# Patient Record
Sex: Female | Born: 1970 | Race: White | Hispanic: No | Smoking: Former smoker
Health system: Southern US, Community
[De-identification: ages and names within clinical notes are randomized; demographics above are authoritative.]

## PROBLEM LIST (undated history)

## (undated) DIAGNOSIS — E282 Polycystic ovarian syndrome: Secondary | ICD-10-CM

## (undated) DIAGNOSIS — F32A Depression, unspecified: Secondary | ICD-10-CM

## (undated) DIAGNOSIS — F329 Major depressive disorder, single episode, unspecified: Secondary | ICD-10-CM

## (undated) DIAGNOSIS — E079 Disorder of thyroid, unspecified: Secondary | ICD-10-CM

## (undated) DIAGNOSIS — Z87442 Personal history of urinary calculi: Secondary | ICD-10-CM

## (undated) DIAGNOSIS — I10 Essential (primary) hypertension: Secondary | ICD-10-CM

## (undated) HISTORY — PX: CYSTOSCOPY: SUR368

## (undated) HISTORY — PX: OTHER SURGICAL HISTORY: SHX169

## (undated) HISTORY — DX: Disorder of thyroid, unspecified: E07.9

## (undated) HISTORY — DX: Major depressive disorder, single episode, unspecified: F32.9

## (undated) HISTORY — DX: Polycystic ovarian syndrome: E28.2

## (undated) HISTORY — DX: Depression, unspecified: F32.A

## (undated) HISTORY — DX: Personal history of urinary calculi: Z87.442

---

## 1999-08-15 ENCOUNTER — Other Ambulatory Visit: Admission: RE | Admit: 1999-08-15 | Discharge: 1999-08-15 | Payer: Self-pay | Admitting: Internal Medicine

## 2000-03-29 ENCOUNTER — Other Ambulatory Visit (HOSPITAL_COMMUNITY): Admission: RE | Admit: 2000-03-29 | Discharge: 2000-04-12 | Payer: Self-pay | Admitting: Psychiatry

## 2000-06-14 ENCOUNTER — Ambulatory Visit (HOSPITAL_COMMUNITY): Admission: RE | Admit: 2000-06-14 | Discharge: 2000-06-14 | Payer: Self-pay | Admitting: *Deleted

## 2000-06-20 ENCOUNTER — Ambulatory Visit (HOSPITAL_COMMUNITY): Admission: RE | Admit: 2000-06-20 | Discharge: 2000-06-20 | Payer: Self-pay | Admitting: *Deleted

## 2003-04-01 ENCOUNTER — Emergency Department (HOSPITAL_COMMUNITY): Admission: EM | Admit: 2003-04-01 | Discharge: 2003-04-01 | Payer: Self-pay | Admitting: Emergency Medicine

## 2004-06-22 ENCOUNTER — Emergency Department (HOSPITAL_COMMUNITY): Admission: EM | Admit: 2004-06-22 | Discharge: 2004-06-23 | Payer: Self-pay | Admitting: Emergency Medicine

## 2004-06-29 ENCOUNTER — Ambulatory Visit (HOSPITAL_COMMUNITY): Admission: RE | Admit: 2004-06-29 | Discharge: 2004-06-29 | Payer: Self-pay | Admitting: Urology

## 2004-07-28 ENCOUNTER — Ambulatory Visit (HOSPITAL_BASED_OUTPATIENT_CLINIC_OR_DEPARTMENT_OTHER): Admission: RE | Admit: 2004-07-28 | Discharge: 2004-07-28 | Payer: Self-pay | Admitting: Urology

## 2004-07-30 ENCOUNTER — Emergency Department (HOSPITAL_COMMUNITY): Admission: EM | Admit: 2004-07-30 | Discharge: 2004-07-30 | Payer: Self-pay | Admitting: *Deleted

## 2004-08-09 ENCOUNTER — Ambulatory Visit (HOSPITAL_COMMUNITY): Admission: RE | Admit: 2004-08-09 | Discharge: 2004-08-09 | Payer: Self-pay | Admitting: Urology

## 2004-08-09 ENCOUNTER — Ambulatory Visit (HOSPITAL_BASED_OUTPATIENT_CLINIC_OR_DEPARTMENT_OTHER): Admission: RE | Admit: 2004-08-09 | Discharge: 2004-08-09 | Payer: Self-pay | Admitting: Urology

## 2004-08-10 ENCOUNTER — Inpatient Hospital Stay (HOSPITAL_COMMUNITY): Admission: EM | Admit: 2004-08-10 | Discharge: 2004-08-11 | Payer: Self-pay | Admitting: Emergency Medicine

## 2011-12-19 ENCOUNTER — Ambulatory Visit (INDEPENDENT_AMBULATORY_CARE_PROVIDER_SITE_OTHER): Payer: BC Managed Care – PPO | Admitting: Physician Assistant

## 2011-12-19 ENCOUNTER — Encounter: Payer: Self-pay | Admitting: Physician Assistant

## 2011-12-19 VITALS — BP 140/92 | HR 120 | Ht 61.5 in | Wt 245.0 lb

## 2011-12-19 DIAGNOSIS — R000 Tachycardia, unspecified: Secondary | ICD-10-CM

## 2011-12-19 DIAGNOSIS — R112 Nausea with vomiting, unspecified: Secondary | ICD-10-CM

## 2011-12-19 DIAGNOSIS — R1013 Epigastric pain: Secondary | ICD-10-CM

## 2011-12-19 DIAGNOSIS — E282 Polycystic ovarian syndrome: Secondary | ICD-10-CM

## 2011-12-19 DIAGNOSIS — I10 Essential (primary) hypertension: Secondary | ICD-10-CM

## 2011-12-19 DIAGNOSIS — R1011 Right upper quadrant pain: Secondary | ICD-10-CM

## 2011-12-19 DIAGNOSIS — Z1239 Encounter for other screening for malignant neoplasm of breast: Secondary | ICD-10-CM

## 2011-12-19 DIAGNOSIS — E039 Hypothyroidism, unspecified: Secondary | ICD-10-CM

## 2011-12-19 DIAGNOSIS — F329 Major depressive disorder, single episode, unspecified: Secondary | ICD-10-CM | POA: Insufficient documentation

## 2011-12-19 MED ORDER — ATENOLOL 50 MG PO TABS: 50.0000 mg | ORAL_TABLET | Freq: Every day | ORAL | Status: DC

## 2011-12-19 MED ORDER — NORGESTIMATE-ETH ESTRADIOL 0.25-35 MG-MCG PO TABS: 1.0000 | ORAL_TABLET | Freq: Every day | ORAL | Status: DC

## 2011-12-19 MED ORDER — LEVOTHYROXINE SODIUM 200 MCG PO TABS: 200.0000 ug | ORAL_TABLET | Freq: Every day | ORAL | Status: DC

## 2011-12-19 MED ORDER — OMEPRAZOLE 40 MG PO CPDR: 40.0000 mg | DELAYED_RELEASE_CAPSULE | Freq: Every day | ORAL | Status: DC

## 2011-12-19 NOTE — Progress Notes (Signed)
  Subjective:    Patient ID: Samantha Cortez, female    DOB: 20-Jul-1970, 41 y.o.   MRN: [id number]  HPI Patient presents to the clinic to establish care and discuss ongoing stomach pain and nausea and vomiting. His symptoms started the beginning of this year. Pain, nausea, vomiting, and burning sensation is not constant but episodic. It had been happening 1-2 times a month but lately it has been happening more frequently about once a week. It has progressively gotten worse each time rating pain at about 8/10. She has a lot of burping, nausea, vomiting, and diarrhea with episodes. She has tried OTC Prilosec/Zantac/antacids and they have not helped.   Her blood pressure has always been borderline. She denies CP, palpitations, SOB, or HA's. ON spironactolone for hair loss but not on anything for blood pressure. Does not exercise and not currently watching her diet.      Review of Systems     Objective:   Physical Exam  Constitutional: She is oriented to person, place, and time. She appears well-developed and well-nourished.       Obese.  HENT:  Head: Normocephalic and atraumatic.  Cardiovascular: Regular rhythm and normal heart sounds.        Tachycardia at 120.  Pulmonary/Chest: Effort normal and breath sounds normal. She has no wheezes.  Abdominal: Soft. Bowel sounds are normal. She exhibits no distension and no mass. There is no tenderness. There is no rebound and no guarding.       Negative for pain with palpation.  Neurological: She is alert and oriented to person, place, and time.  Skin: Skin is warm and dry.  Psychiatric: She has a normal mood and affect. Her behavior is normal.          Assessment & Plan:  Epigastric pain/RUQ pain/Nausea and vomiting-Will get a Complete abdominal US to evaluate gallbladder and stomach. Will call with results. Start omeprazole 40mg  every day. Discussed a GERD diet. Follow up if not improving.  Hypertension/tachycardia- Started atenolol 50mg   daily. Recheck Blood pressure with nurse visit in 1 month and follow up OV in 2-3 months.   Will refer for mammogram. Patient is aware that she needs CPE.

## 2011-12-19 NOTE — Patient Instructions (Addendum)
Will refer for mammogram and call with Ultrasound.   Start Omeprazole daily in the morning before breakfast. Start Atenolol 50mg  at bedtime. Reheck with nurse visit in 1 month and me in 2-3 months for CPE.   Try to exercise by walking. Don't drink your calories.   1.5 Gram Low Sodium Diet A 1.5 gram sodium diet restricts the amount of sodium in the diet to no more than 1.5 g or 1500 mg daily. The American Heart Association recommends Americans over the age of 25 to consume no more than 1500 mg of sodium each day to reduce the risk of developing high blood pressure. Research also shows that limiting sodium may reduce heart attack and stroke risk. Many foods contain sodium for flavor and sometimes as a preservative. When the amount of sodium in a diet needs to be low, it is important to know what to look for when choosing foods and drinks. The following includes some information and guidelines to help make it easier for you to adapt to a low sodium diet. QUICK TIPS  Do not add salt to food.   Avoid convenience items and fast food.   Choose unsalted snack foods.   Buy lower sodium products, often labeled as "lower sodium" or "no salt added."   Check food labels to learn how much sodium is in 1 serving.   When eating at a restaurant, ask that your food be prepared with less salt or none, if possible.  READING FOOD LABELS FOR SODIUM INFORMATION The nutrition facts label is a good place to find how much sodium is in foods. Look for products with no more than 400 mg of sodium per serving. Remember that 1.5 g = 1500 mg. The food label may also list foods as:  Sodium-free: Less than 5 mg in a serving.   Very low sodium: 35 mg or less in a serving.   Low-sodium: 140 mg or less in a serving.   Light in sodium: 50% less sodium in a serving. For example, if a food that usually has 300 mg of sodium is changed to become light in sodium, it will have 150 mg of sodium.   Reduced sodium: 25% less  sodium in a serving. For example, if a food that usually has 400 mg of sodium is changed to reduced sodium, it will have 300 mg of sodium.  CHOOSING FOODS Grains  Avoid: Salted crackers and snack items. Some cereals, including instant hot cereals. Bread stuffing and biscuit mixes. Seasoned rice or pasta mixes.   Choose: Unsalted snack items. Low-sodium cereals, oats, puffed wheat and rice, shredded wheat. English muffins and bread. Pasta.  Meats  Avoid: Salted, canned, smoked, spiced, pickled meats, including fish and poultry. Bacon, ham, sausage, cold cuts, hot dogs, anchovies.   Choose: Low-sodium canned tuna and salmon. Fresh or frozen meat, poultry, and fish.  Dairy  Avoid: Processed cheese and spreads. Cottage cheese. Buttermilk and condensed milk. Regular cheese.   Choose: Milk. Low-sodium cottage cheese. Yogurt. Sour cream. Low-sodium cheese.  Fruits and Vegetables  Avoid: Regular canned vegetables. Regular canned tomato sauce and paste. Frozen vegetables in sauces. Olives. Rosita Fire. Relishes. Sauerkraut.   Choose: Low-sodium canned vegetables. Low-sodium tomato sauce and paste. Frozen or fresh vegetables. Fresh and frozen fruit.  Condiments  Avoid: Canned and packaged gravies. Worcestershire sauce. Tartar sauce. Barbecue sauce. Soy sauce. Steak sauce. Ketchup. Onion, garlic, and table salt. Meat flavorings and tenderizers.   Choose: Fresh and dried herbs and spices. Low-sodium varieties of  mustard and ketchup. Lemon juice. Tabasco sauce. Horseradish.  SAMPLE 1.5 GRAM SODIUM MEAL PLAN Breakfast / Sodium (mg)  1 cup low-fat milk / 143 mg   1 whole-wheat English muffin / 240 mg   1 tbs heart-healthy margarine / 153 mg   1 hard-boiled egg / 139 mg   1 small orange / 0 mg  Lunch / Sodium (mg)  1 cup raw carrots / 76 mg   2 tbs no salt added peanut butter / 5 mg   2 slices whole-wheat bread / 270 mg   1 tbs jelly / 6 mg    cup red grapes / 2 mg  Dinner / Sodium  (mg)  1 cup whole-wheat pasta / 2 mg   1 cup low-sodium tomato sauce / 73 mg   3 oz lean ground beef / 57 mg   1 small side salad (1 cup raw spinach leaves,  cup cucumber,  cup yellow bell pepper) with 1 tsp olive oil and 1 tsp red wine vinegar / 25 mg  Snack / Sodium (mg)  1 container low-fat vanilla yogurt / 107 mg   3 graham cracker squares / 127 mg  Nutrient Analysis  Calories: 1745   Protein: 75 g   Carbohydrate: 237 g   Fat: 57 g   Sodium: 1425 mg  Document Released: 05/21/2005 Document Revised: 05/10/2011 Document Reviewed: 08/22/2009 [facility] Patient Information 2012 [place], [place].

## 2011-12-25 ENCOUNTER — Ambulatory Visit (INDEPENDENT_AMBULATORY_CARE_PROVIDER_SITE_OTHER): Payer: BC Managed Care – PPO

## 2011-12-25 DIAGNOSIS — K838 Other specified diseases of biliary tract: Secondary | ICD-10-CM

## 2011-12-25 DIAGNOSIS — R1011 Right upper quadrant pain: Secondary | ICD-10-CM

## 2011-12-26 ENCOUNTER — Other Ambulatory Visit: Payer: Self-pay | Admitting: Physician Assistant

## 2011-12-26 NOTE — Progress Notes (Signed)
Sent referral for HIDA scan. Do you want anything for nausea and vomiting? Could come by and pick up sample of different PPI Dexilant or Nexium and see if that helps at all.

## 2011-12-27 ENCOUNTER — Telehealth: Payer: Self-pay | Admitting: *Deleted

## 2011-12-27 MED ORDER — PROMETHAZINE HCL 25 MG PO TABS: 25.0000 mg | ORAL_TABLET | Freq: Four times a day (QID) | ORAL | Status: DC | PRN

## 2011-12-27 NOTE — Telephone Encounter (Signed)
Pt is requesting a prescription for nausea medication.

## 2011-12-27 NOTE — Telephone Encounter (Signed)
Let patient know has been sent to pharmacy.

## 2011-12-27 NOTE — Progress Notes (Signed)
LMOM informing Pt  

## 2011-12-28 NOTE — Telephone Encounter (Signed)
LMOM

## 2012-01-11 ENCOUNTER — Other Ambulatory Visit: Payer: Self-pay | Admitting: Physician Assistant

## 2012-01-11 DIAGNOSIS — R1013 Epigastric pain: Secondary | ICD-10-CM

## 2012-01-11 DIAGNOSIS — R1011 Right upper quadrant pain: Secondary | ICD-10-CM

## 2012-01-17 ENCOUNTER — Encounter (HOSPITAL_COMMUNITY)
Admission: RE | Admit: 2012-01-17 | Discharge: 2012-01-17 | Disposition: A | Discharge status: Home / Self Care | Payer: BC Managed Care – PPO | Source: Ambulatory Visit | Attending: Physician Assistant | Admitting: Physician Assistant

## 2012-01-17 DIAGNOSIS — R1013 Epigastric pain: Secondary | ICD-10-CM

## 2012-01-17 DIAGNOSIS — R1011 Right upper quadrant pain: Secondary | ICD-10-CM

## 2012-01-17 MED ORDER — TECHNETIUM TC 99M MEBROFENIN IV KIT
5.0000 | PACK | Freq: Once | INTRAVENOUS | Status: AC | PRN
  Administered 2012-01-17: 5 via INTRAVENOUS

## 2012-01-17 MED ORDER — SINCALIDE 5 MCG IJ SOLR
0.0200 ug/kg | Freq: Once | INTRAMUSCULAR | Status: AC
  Administered 2012-01-17: 5.23 ug via INTRAVENOUS

## 2012-01-17 MED ORDER — SINCALIDE 5 MCG IJ SOLR
INTRAMUSCULAR | Status: AC
  Filled 2012-01-17: qty 5

## 2012-01-18 ENCOUNTER — Encounter: Payer: BC Managed Care – PPO | Admitting: Family Medicine

## 2012-01-18 NOTE — Progress Notes (Signed)
Error

## 2012-01-20 ENCOUNTER — Other Ambulatory Visit: Payer: Self-pay | Admitting: Physician Assistant

## 2012-01-20 MED ORDER — ESOMEPRAZOLE MAGNESIUM 40 MG PO PACK: 40.0000 mg | PACK | Freq: Every day | ORAL | Status: DC

## 2012-01-21 ENCOUNTER — Telehealth: Payer: Self-pay

## 2012-01-21 ENCOUNTER — Other Ambulatory Visit: Payer: Self-pay | Admitting: *Deleted

## 2012-01-21 MED ORDER — OMEPRAZOLE 40 MG PO CPDR: 40.0000 mg | DELAYED_RELEASE_CAPSULE | Freq: Every day | ORAL | Status: DC

## 2012-01-21 NOTE — Telephone Encounter (Signed)
Message copied by Chalmers Cater on Mon Jan 21, 2012  3:54 PM ------      Message from: [place]et, Lonna Cobb      Created: Sun Jan 20, 2012 10:55 PM       Call Pt: Let her know HIDA scan was normal in function. Lets try a different acid reflux medication. I will call in Nexium to take daily. If pain persists will refer to GI.

## 2012-01-21 NOTE — Telephone Encounter (Signed)
Yes Nexium already sent.

## 2012-01-21 NOTE — Telephone Encounter (Signed)
I advised patient of her results from the HIDA scan. She agreed to take the nexium. Was a prescription already sent?

## 2012-01-29 ENCOUNTER — Ambulatory Visit: Payer: BC Managed Care – PPO

## 2012-02-01 ENCOUNTER — Ambulatory Visit (INDEPENDENT_AMBULATORY_CARE_PROVIDER_SITE_OTHER): Payer: BC Managed Care – PPO | Admitting: Physician Assistant

## 2012-02-01 ENCOUNTER — Encounter: Payer: Self-pay | Admitting: Physician Assistant

## 2012-02-01 VITALS — BP 134/87 | HR 105 | Wt 252.0 lb

## 2012-02-01 DIAGNOSIS — Z131 Encounter for screening for diabetes mellitus: Secondary | ICD-10-CM

## 2012-02-01 DIAGNOSIS — K649 Unspecified hemorrhoids: Secondary | ICD-10-CM

## 2012-02-01 DIAGNOSIS — E039 Hypothyroidism, unspecified: Secondary | ICD-10-CM

## 2012-02-01 DIAGNOSIS — G4700 Insomnia, unspecified: Secondary | ICD-10-CM | POA: Insufficient documentation

## 2012-02-01 DIAGNOSIS — I10 Essential (primary) hypertension: Secondary | ICD-10-CM

## 2012-02-01 DIAGNOSIS — Z13220 Encounter for screening for lipoid disorders: Secondary | ICD-10-CM

## 2012-02-01 DIAGNOSIS — R197 Diarrhea, unspecified: Secondary | ICD-10-CM

## 2012-02-01 DIAGNOSIS — Z0000 Encounter for general adult medical examination without abnormal findings: Secondary | ICD-10-CM

## 2012-02-01 MED ORDER — NORGESTIMATE-ETH ESTRADIOL 0.25-35 MG-MCG PO TABS: 1.0000 | ORAL_TABLET | Freq: Every day | ORAL | Status: DC

## 2012-02-01 MED ORDER — SPIRONOLACTONE 25 MG PO TABS: 25.0000 mg | ORAL_TABLET | Freq: Every day | ORAL | Status: DC

## 2012-02-01 MED ORDER — LORAZEPAM 0.5 MG PO TABS: 0.5000 mg | ORAL_TABLET | Freq: Every evening | ORAL | Status: DC | PRN

## 2012-02-01 MED ORDER — ATENOLOL 100 MG PO TABS: 100.0000 mg | ORAL_TABLET | Freq: Every day | ORAL | Status: DC

## 2012-02-01 MED ORDER — OMEPRAZOLE 40 MG PO CPDR: 40.0000 mg | DELAYED_RELEASE_CAPSULE | Freq: Every day | ORAL | Status: DC

## 2012-02-01 MED ORDER — PHENTERMINE HCL 15 MG PO CAPS: 15.0000 mg | ORAL_CAPSULE | ORAL | Status: DC

## 2012-02-01 MED ORDER — AMITRIPTYLINE HCL 100 MG PO TABS: 100.0000 mg | ORAL_TABLET | Freq: Every day | ORAL | Status: DC

## 2012-02-01 NOTE — Patient Instructions (Addendum)
Refilled medications. Start loperamide 4mg  initally then 2mg  after each loose stool. Maximum 16mg  a day.  Will refer for colonoscopy.  Hemorrhoids Hemorrhoids are enlarged (dilated) veins around the rectum. There are 2 types of hemorrhoids, and the type of hemorrhoid is determined by its location. Internal hemorrhoids occur in the veins just inside the rectum.They are usually not painful, but they may bleed.However, they may poke through to the outside and become irritated and painful. External hemorrhoids involve the veins outside the anus and can be felt as a painful swelling or hard lump near the anus.They are often itchy and may crack and bleed. Sometimes clots will form in the veins. This makes them swollen and painful. These are called thrombosed hemorrhoids. CAUSES Causes of hemorrhoids include:  Pregnancy. This increases the pressure in the hemorrhoidal veins.   Constipation.   Straining to have a bowel movement.   Obesity.   Heavy lifting or other activity that caused you to strain.  TREATMENT Most of the time hemorrhoids improve in 1 to 2 weeks. However, if symptoms do not seem to be getting better or if you have a lot of rectal bleeding, your caregiver may perform a procedure to help make the hemorrhoids get smaller or remove them completely.Possible treatments include:  Rubber band ligation. A rubber band is placed at the base of the hemorrhoid to cut off the circulation.   Sclerotherapy. A chemical is injected to shrink the hemorrhoid.   Infrared light therapy. Tools are used to burn the hemorrhoid.   Hemorrhoidectomy. This is surgical removal of the hemorrhoid.  HOME CARE INSTRUCTIONS   Increase fiber in your diet. Ask your caregiver about using fiber supplements.   Drink enough water and fluids to keep your urine clear or pale yellow.   Exercise regularly.   Go to the bathroom when you have the urge to have a bowel movement. Do not wait.   Avoid straining to  have bowel movements.   Keep the anal area dry and clean.   Only take over-the-counter or prescription medicines for pain, discomfort, or fever as directed by your caregiver.  If your hemorrhoids are thrombosed:  Take warm sitz baths for 20 to 30 minutes, 3 to 4 times per day.   If the hemorrhoids are very tender and swollen, place ice packs on the area as tolerated. Using ice packs between sitz baths may be helpful. Fill a plastic bag with ice. Place a towel between the bag of ice and your skin.   Medicated creams and suppositories may be used or applied as directed.   Do not use a donut-shaped pillow or sit on the toilet for long periods. This increases blood pooling and pain.  SEEK MEDICAL CARE IF:   You have increasing pain and swelling that is not controlled with your medicine.   You have uncontrolled bleeding.   You have difficulty or you are unable to have a bowel movement.   You have pain or inflammation outside the area of the hemorrhoids.   You have chills or an oral temperature above 102 F (38.9 C).  MAKE SURE YOU:   Understand these instructions.   Will watch your condition.   Will get help right away if you are not doing well or get worse.  Document Released: 05/18/2000 Document Revised: 05/10/2011 Document Reviewed: 09/23/2007 [facility] Patient Information 2012 [place], [place].

## 2012-02-04 NOTE — Progress Notes (Signed)
Subjective:     Samantha Cortez is a 41 y.o. female and is here for a comprehensive physical exam. The patient reports problems - with diarrhea on and off. the past week has been not stop diarrhea to wear she feels "raw" down there and has seen some blood in her stool.  She has ongoing issue of hypothyrodism, Depression, and GERD. TSH has not been checked in many months since increased her dose to . Acid reflux is ongoing but controlled with medication. Need refill today. Depression is well controlled. She is trying to exercise regularly, eat healthier and lose some weight. She has noticed the diarrhea started intermitently after stopping cymbalta. Her father has hx of chrons. She occasionaly does noticed mucus in stools. She has not tried anything to make diarrhea better and doesn't notice anything that makes worse. Periods are regular. She denies any travel outside country, abx exposure in last 3 months, or anyone else around her having diarrhea. She occasionally has abdominal cramping but not continually. She denies any fever, chills, muscle aches, or painful urination.  She really wants to start phentermin for weight loss today. Denies any CP, palpitations, or SOb.  History   Social History  . Marital Status: Divorced    Spouse Name: N/A    Number of Children: N/A  . Years of Education: N/A   Occupational History  . Not on file.   Social History Main Topics  . Smoking status: Former Smoker -- 0.3 packs/day for 2 years    Types: Cigarettes    Quit date: 12/02/2009  . Smokeless tobacco: Not on file  . Alcohol Use: Not on file  . Drug Use: Not on file  . Sexually Active: Yes   Other Topics Concern  . Not on file   Social History Narrative  . No narrative on file   Health Maintenance  Topic Date Due  . Mammogram  12/19/2010  . Influenza Vaccine  03/04/2012  . Pap Smear  06/20/2013  . Tetanus/tdap  12/19/2015    The following portions of the patient's history were reviewed  and updated as appropriate: allergies, current medications, past family history, past medical history, past social history, past surgical history and problem list.  Review of Systems A comprehensive review of systems was negative.   Objective:    BP 134/87  Pulse 105  Wt 252 lb (114.306 kg)  LMP 01/17/2012 General appearance: alert, cooperative, appears stated age and moderately obese Head: Normocephalic, without obvious abnormality, atraumatic Eyes: conjunctivae/corneas clear. PERRL, EOM's intact. Fundi benign. Ears: normal TM's and external ear canals both ears Nose: Nares normal. Septum midline. Mucosa normal. No drainage or sinus tenderness. Throat: lips, mucosa, and tongue normal; teeth and gums normal Neck: no adenopathy, no carotid bruit, no JVD, supple, symmetrical, trachea midline and thyroid not enlarged, symmetric, no tenderness/mass/nodules Back: symmetric, no curvature. ROM normal. No CVA tenderness. Lungs: clear to auscultation bilaterally Breasts: normal appearance, no masses or tenderness Heart: regular rate and rhythm, S1, S2 normal, no murmur, click, rub or gallop Abdomen: soft, non-tender; bowel sounds normal; no masses,  no organomegaly Extremities: extremities normal, atraumatic, no cyanosis or edema Pulses: 2+ and symmetric Skin: Skin color, texture, turgor normal. No rashes or lesions Lymph nodes: Cervical, supraclavicular, and axillary nodes normal. Neurologic: Grossly normal    Assessment:    Healthy female exam.      Plan:    CPE/Diarrhea/Hemrrhoids/Obesity/Hypothyroidism- Vaccines up to date. Reminded to reschedule mammogram since could not make first appt. Gave  Handout on hemorrhoids and how to treat. Call if not improving. Will get fasting labs and recheck TSH level. Since has family hx of chrons I think she should be evaluated with colonoscopy. I suspect that diarrhea is a component of IBS. I will still give cup to evaluate stool for any bacteria if  diarrhea continues for the next 3-4 days. Pt given ok to take imodium to try to stop diarrhea. We could consider evaluating for food allergies also to see if that is aiding in some of diarrhea. Pt reminded to exercise regularly and keep a healthy diet. Calcium 500mg  twice a day or 4 servings recommended. I did go ahead and give phenterimine low dose to start and see if helped with losing weight. Pt aware of side effects and potentially increasing BP. Will monitor in 1 month. See After Visit Summary for Counseling Recommendations

## 2012-02-05 ENCOUNTER — Other Ambulatory Visit: Payer: Self-pay | Admitting: *Deleted

## 2012-02-05 MED ORDER — LORAZEPAM 0.5 MG PO TABS: 0.5000 mg | ORAL_TABLET | Freq: Every evening | ORAL | Status: DC | PRN

## 2012-02-05 MED ORDER — PHENTERMINE HCL 15 MG PO CAPS: 15.0000 mg | ORAL_CAPSULE | ORAL | Status: DC

## 2012-02-12 ENCOUNTER — Telehealth: Payer: Self-pay | Admitting: *Deleted

## 2012-02-12 MED ORDER — HYDROCORTISONE ACETATE 25 MG RE SUPP: 25.0000 mg | Freq: Two times a day (BID) | RECTAL | Status: AC

## 2012-02-12 NOTE — Telephone Encounter (Signed)
LMOM

## 2012-02-12 NOTE — Telephone Encounter (Signed)
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 States she has tried OTC meds and they are not helping. Please advise.

## 2012-02-12 NOTE — Telephone Encounter (Signed)
I've sent in an Rx for steroid suppositories to [facility], i've had the best success with this product when it comes to irritated hemorrhoids.  If not better in 3-4 days return for reevaluation.

## 2012-02-29 ENCOUNTER — Ambulatory Visit: Payer: BC Managed Care – PPO | Admitting: Physician Assistant

## 2012-02-29 DIAGNOSIS — Z0289 Encounter for other administrative examinations: Secondary | ICD-10-CM

## 2012-03-12 ENCOUNTER — Telehealth: Payer: Self-pay | Admitting: Physician Assistant

## 2012-03-12 NOTE — Telephone Encounter (Signed)
LMOM informing Pt  

## 2012-03-12 NOTE — Telephone Encounter (Signed)
Call pt and let her know that we received a letter stating that digestive health has not been able to contact you to schedule colonoscopy. Make sure patient is aware that I think it would be beneficial to have this with family hx of chrons disease. If there is anything we can do let us know.

## 2012-03-13 ENCOUNTER — Other Ambulatory Visit: Payer: Self-pay | Admitting: Physician Assistant

## 2012-03-21 ENCOUNTER — Telehealth: Payer: Self-pay | Admitting: *Deleted

## 2012-03-21 MED ORDER — CITALOPRAM HYDROBROMIDE 10 MG PO TABS: 10.0000 mg | ORAL_TABLET | Freq: Every day | ORAL | Status: DC

## 2012-03-21 NOTE — Telephone Encounter (Signed)
Let's start at 10mg  right now and then we can increase accordingly. Come see me in the next month. Taking at bedtime is the best way to start.

## 2012-03-21 NOTE — Telephone Encounter (Signed)
Left messsage on vm

## 2012-03-21 NOTE — Telephone Encounter (Signed)
Pt calls and wanted to know if you could start her back on the Citalopram 20mg  that you all had stopped a while back. She does have appt set up with you and didn't know if you would start her back on it or want to see her first. Per pt ok to leave message on VM

## 2012-03-24 ENCOUNTER — Ambulatory Visit: Payer: BC Managed Care – PPO | Admitting: Physician Assistant

## 2012-03-24 DIAGNOSIS — Z0289 Encounter for other administrative examinations: Secondary | ICD-10-CM

## 2012-04-07 ENCOUNTER — Other Ambulatory Visit: Payer: Self-pay | Admitting: Physician Assistant

## 2012-04-07 MED ORDER — LORAZEPAM 0.5 MG PO TABS: 0.5000 mg | ORAL_TABLET | Freq: Every evening | ORAL | Status: DC | PRN

## 2012-04-12 ENCOUNTER — Other Ambulatory Visit: Payer: Self-pay | Admitting: Physician Assistant

## 2012-04-21 ENCOUNTER — Other Ambulatory Visit: Payer: Self-pay | Admitting: Physician Assistant

## 2012-04-28 ENCOUNTER — Other Ambulatory Visit: Payer: Self-pay | Admitting: Physician Assistant

## 2012-05-19 ENCOUNTER — Other Ambulatory Visit: Payer: Self-pay

## 2012-05-19 MED ORDER — LORAZEPAM 0.5 MG PO TABS: 0.5000 mg | ORAL_TABLET | Freq: Every evening | ORAL | Status: DC | PRN

## 2012-05-20 ENCOUNTER — Other Ambulatory Visit: Payer: Self-pay | Admitting: Physician Assistant

## 2012-05-24 ENCOUNTER — Other Ambulatory Visit: Payer: Self-pay | Admitting: Physician Assistant

## 2012-06-13 ENCOUNTER — Ambulatory Visit: Payer: BC Managed Care – PPO | Admitting: Physician Assistant

## 2012-06-20 ENCOUNTER — Ambulatory Visit: Payer: BC Managed Care – PPO | Admitting: Physician Assistant

## 2012-06-27 ENCOUNTER — Ambulatory Visit: Payer: BC Managed Care – PPO | Admitting: Physician Assistant

## 2012-06-27 DIAGNOSIS — Z0289 Encounter for other administrative examinations: Secondary | ICD-10-CM

## 2012-06-29 ENCOUNTER — Other Ambulatory Visit: Payer: Self-pay | Admitting: Physician Assistant

## 2012-08-09 ENCOUNTER — Other Ambulatory Visit: Payer: Self-pay | Admitting: Physician Assistant

## 2012-09-26 ENCOUNTER — Other Ambulatory Visit: Payer: Self-pay | Admitting: Physician Assistant

## 2012-09-26 ENCOUNTER — Ambulatory Visit: Payer: BC Managed Care – PPO | Admitting: Physician Assistant

## 2012-09-26 DIAGNOSIS — Z0289 Encounter for other administrative examinations: Secondary | ICD-10-CM

## 2012-09-29 NOTE — Telephone Encounter (Signed)
Must make appointment 

## 2012-10-10 ENCOUNTER — Other Ambulatory Visit: Payer: Self-pay | Admitting: Physician Assistant

## 2012-10-10 ENCOUNTER — Other Ambulatory Visit: Payer: Self-pay | Admitting: Family Medicine

## 2012-11-03 ENCOUNTER — Other Ambulatory Visit: Payer: Self-pay | Admitting: Physician Assistant

## 2012-11-03 ENCOUNTER — Other Ambulatory Visit: Payer: Self-pay | Admitting: Family Medicine

## 2012-11-19 ENCOUNTER — Other Ambulatory Visit: Payer: Self-pay | Admitting: Family Medicine

## 2012-11-19 ENCOUNTER — Other Ambulatory Visit: Payer: Self-pay | Admitting: Physician Assistant

## 2012-11-24 ENCOUNTER — Encounter: Payer: Self-pay | Admitting: Physician Assistant

## 2012-11-24 ENCOUNTER — Ambulatory Visit (INDEPENDENT_AMBULATORY_CARE_PROVIDER_SITE_OTHER): Payer: BC Managed Care – PPO | Admitting: Physician Assistant

## 2012-11-24 VITALS — BP 137/97 | HR 104 | Wt 272.0 lb

## 2012-11-24 DIAGNOSIS — E282 Polycystic ovarian syndrome: Secondary | ICD-10-CM

## 2012-11-24 DIAGNOSIS — I10 Essential (primary) hypertension: Secondary | ICD-10-CM

## 2012-11-24 DIAGNOSIS — Z13220 Encounter for screening for lipoid disorders: Secondary | ICD-10-CM

## 2012-11-24 DIAGNOSIS — E039 Hypothyroidism, unspecified: Secondary | ICD-10-CM

## 2012-11-24 DIAGNOSIS — G4700 Insomnia, unspecified: Secondary | ICD-10-CM

## 2012-11-24 DIAGNOSIS — Z1239 Encounter for other screening for malignant neoplasm of breast: Secondary | ICD-10-CM

## 2012-11-24 DIAGNOSIS — F329 Major depressive disorder, single episode, unspecified: Secondary | ICD-10-CM

## 2012-11-24 DIAGNOSIS — Z131 Encounter for screening for diabetes mellitus: Secondary | ICD-10-CM

## 2012-11-24 MED ORDER — CITALOPRAM HYDROBROMIDE 20 MG PO TABS: 20.0000 mg | ORAL_TABLET | Freq: Every day | ORAL | Status: DC

## 2012-11-24 MED ORDER — AMITRIPTYLINE HCL 100 MG PO TABS: 100.0000 mg | ORAL_TABLET | Freq: Every day | ORAL | Status: DC

## 2012-11-24 MED ORDER — ATENOLOL 100 MG PO TABS: 100.0000 mg | ORAL_TABLET | Freq: Every day | ORAL | Status: DC

## 2012-11-24 MED ORDER — OMEPRAZOLE 40 MG PO CPDR: 40.0000 mg | DELAYED_RELEASE_CAPSULE | Freq: Every day | ORAL | Status: DC

## 2012-11-24 MED ORDER — SPIRONOLACTONE 25 MG PO TABS: 25.0000 mg | ORAL_TABLET | Freq: Every day | ORAL | Status: DC

## 2012-11-24 MED ORDER — EFLORNITHINE HCL 13.9 % EX CREA: TOPICAL_CREAM | CUTANEOUS | Status: DC

## 2012-11-24 MED ORDER — LEVOTHYROXINE SODIUM 175 MCG PO TABS: 175.0000 ug | ORAL_TABLET | Freq: Every day | ORAL | Status: DC

## 2012-11-24 NOTE — Patient Instructions (Addendum)
Will call with mammogram.   Will call with results.

## 2012-11-24 NOTE — Addendum Note (Signed)
Addended by: Jomarie Longs on: 11/24/2012 01:41 PM   Modules accepted: Orders

## 2012-11-24 NOTE — Progress Notes (Signed)
  Subjective:    Patient ID: Samantha Cortez, female    DOB: 10-20-1970, 42 y.o.   MRN: [id number]  HPI Patient is a 42 year old female who presents to the clinic to followup on hypothyroidism, hypertension, depression.  Hypothyroidism-she has been out of her levothyroxin for the last 2 weeks and not been taking it. At last visit gave her lots to get a thyroid drawn and was not drawn. She does feel like she is at too high of a dose. Her last doctor and [facility] up her levothyroxin to see if that helped with weight loss. She would like to go back down to her previous dose of 175 MCG and then recheck thyroid level. She is having symptoms of sweating and more anxiousness.  PCO S.-she admits to not walking on weight loss and not making any diet changes. She continues taking birth control and spironactlone. Patient needs refills today. She would also like a prescription for a vanqi cream to help with facial hair. She has been given this prescription in the past and does help a lot.  Depression-she is currently on Celexa 10 mg and feels like her symptoms of wanting to lay in bed all day long or worsening. She was feeling pretty good but recently is more depressed. Denies any thoughts of suicide. She has also been out of amitriptyline that she uses for sleep. She does not know if that is making her feel more depressed also.  Hypertension-patient's blood pressure today is slightly up but she has not taken her blood pressure medicine this morning because she is used to taking when she gets to work. She denies any chest pains, palpitations, shortness of breath, headaches, vision changes. She's tolerating the medication very well.   Review of Systems     Objective:   Physical Exam  Constitutional: She is oriented to person, place, and time. She appears well-developed and well-nourished.  Obese  HENT:  Head: Normocephalic and atraumatic.  Eyes: Conjunctivae are normal.  Neck: Normal range of motion.  Neck supple.  Cardiovascular: Normal rate, regular rhythm and normal heart sounds.   Pulmonary/Chest: Effort normal and breath sounds normal. She has no wheezes.  Neurological: She is alert and oriented to person, place, and time. No cranial nerve deficit.  Skin: Skin is warm and dry.  Psychiatric: She has a normal mood and affect. Her behavior is normal.          Assessment & Plan:  Hypothyroidism-gave patient lab slip to get a thyroid level drawn. Only refilled Synthroid for one more month at 175 MCG. Patient needs to have thyroid level drawn before the month is out.  PCOS- refilled birth control and spironolactone. Discuss with patient the she does need a complete physical and bimanual exam. Her last Pap was 2012 in normal therefore no Pap smear needs to be sent off until 2015. Reminded patient of importance of diet and weight control with PCO S.  Depression-increase Celexa to 20 mg. Discussed with patient that being out of amitriptyline could also be affecting her mood. Will refill amitriptyline to start taking at night for insomnia and depression.  Hypertension-refilled atenolol. Discussed with patient importance of taking blood pressure medicine when coming to the doctor's office so we can get an accurate reading if blood pressure is controlled. Reminded patient of importance of diet and exercise.   Screening labs were ordered. Patient is to followup at least in the next 6 months maybe sooner if abnormal labs.

## 2012-12-23 ENCOUNTER — Encounter: Payer: Self-pay | Admitting: *Deleted

## 2012-12-23 ENCOUNTER — Emergency Department
Admission: EM | Admit: 2012-12-23 | Discharge: 2012-12-23 | Disposition: A | Discharge status: Home / Self Care | Payer: BC Managed Care – PPO | Source: Home / Self Care | Attending: Emergency Medicine | Admitting: Emergency Medicine

## 2012-12-23 DIAGNOSIS — R112 Nausea with vomiting, unspecified: Secondary | ICD-10-CM

## 2012-12-23 DIAGNOSIS — R002 Palpitations: Secondary | ICD-10-CM

## 2012-12-23 HISTORY — DX: Essential (primary) hypertension: I10

## 2012-12-23 LAB — COMPLETE METABOLIC PANEL WITH GFR
ALT: 47 U/L — ABNORMAL HIGH (ref 0–35)
AST: 95 U/L — ABNORMAL HIGH (ref 0–37)
Albumin: 4.1 g/dL (ref 3.5–5.2)
Alkaline Phosphatase: 108 U/L (ref 39–117)
BUN: 12 mg/dL (ref 6–23)
CO2: 22 mEq/L (ref 19–32)
Calcium: 9.3 mg/dL (ref 8.4–10.5)
Chloride: 103 mEq/L (ref 96–112)
Creat: 0.88 mg/dL (ref 0.50–1.10)
GFR, Est African American: >89 mL/min
GFR, Est Non African American: 81 mL/min
Glucose, Bld: 164 mg/dL — ABNORMAL HIGH (ref 70–99)
Potassium: 4.8 mEq/L (ref 3.5–5.3)
Sodium: 138 mEq/L (ref 135–145)
Total Bilirubin: 0.2 mg/dL — ABNORMAL LOW (ref 0.3–1.2)
Total Protein: 7.5 g/dL (ref 6.0–8.3)

## 2012-12-23 LAB — POCT CBC W AUTO DIFF (K'VILLE URGENT CARE)

## 2012-12-23 LAB — TSH: TSH: 42.903 u[IU]/mL — ABNORMAL HIGH (ref 0.350–4.500)

## 2012-12-23 MED ORDER — SODIUM CHLORIDE 0.9 % IV BOLUS (SEPSIS)
500.0000 mL | Freq: Once | INTRAVENOUS | Status: AC
  Administered 2012-12-23: 500 mL via INTRAVENOUS

## 2012-12-23 MED ORDER — PROMETHAZINE HCL 25 MG/ML IJ SOLN
25.0000 mg | Freq: Once | INTRAMUSCULAR | Status: AC
  Administered 2012-12-23: 25 mg via INTRAMUSCULAR

## 2012-12-23 MED ORDER — ONDANSETRON 4 MG PO TBDP: 4.0000 mg | ORAL_TABLET | Freq: Three times a day (TID) | ORAL | Status: AC | PRN

## 2012-12-23 NOTE — ED Notes (Signed)
Pt c/o HA that started x 2 days ago which has since resolved. She also c/o "heart fluttering", nausea, and feeling shaky x 2 days. Denies chest pain. She is sweaty and SOB which she reports are normal for her because of her weight.

## 2012-12-23 NOTE — ED Provider Notes (Signed)
History    CSN: [id number] Arrival date & time 12/23/12  6644  First MD Initiated Contact with Patient 12/23/12 [phone]     Chief complaint: Nausea, vomiting, shaking, palpitations  Patient is a 42 y.o. female presenting with vomiting. The history is provided by the patient.  Emesis Severity:  Mild Duration:  2 days Timing:  Intermittent Number of daily episodes:  Dry heaves started this a.m. x1 Emesis appearance: Scant amount of clear mucus. Progression:  Worsening Chronicity:  New Recent urination:  Decreased Context: not post-tussive and not self-induced   Relieved by:  Nothing Worsened by:  Nothing tried Ineffective treatments:  None tried Associated symptoms: diarrhea (2 loose watery stools daily without blood or mucus) and headaches (Mild without focal neurologic symptoms. No visual change)   Associated symptoms: no abdominal pain, no chills, no cough, no fever, no myalgias, no sore throat and no URI   Associated symptoms comment:  Nausea, shaky feeling, fast heart beat and "heart fluttering" Risk factors: no alcohol use and no travel to endemic areas    Long-standing history of hypothyroid. Her levothyroxin dose was recently changed by her PCP about a month ago. Celexa was increased at that visit with PCP. Other Notes from that visit with PCP one month ago indicate noncompliance with medication at times. Past Medical History  Diagnosis Date  . Depression   . Thyroid disease   . History of kidney stones   . PCOS (polycystic ovarian syndrome)   . Hypertension    Past Surgical History  Procedure Laterality Date  . Cystoscopy    . Lithtripsy     Family History  Problem Relation Age of Onset  . Hypertension Father   . Cancer Paternal Grandmother     ovarian cancer  . Diabetes Paternal Grandfather   . Hypertension Paternal Grandfather    History  Substance Use Topics  . Smoking status: Former Smoker -- 0.30 packs/day for 2 years    Types: Cigarettes    Quit  date: 12/02/2009  . Smokeless tobacco: Not on file  . Alcohol Use: Not on file   OB History   Grav Para Term Preterm Abortions TAB SAB Ect Mult Living                 Review of Systems  Constitutional: Positive for diaphoresis. Negative for fever and chills.  HENT: Negative for sore throat.   Eyes: Negative.   Respiratory: Negative for cough, chest tightness, shortness of breath and wheezing.   Cardiovascular: Positive for palpitations. Negative for chest pain and leg swelling.  Gastrointestinal: Positive for nausea, vomiting and diarrhea (2 loose watery stools daily without blood or mucus). Negative for abdominal pain and blood in stool.  Genitourinary: Negative.        She denies chance of pregnancy, as she denies being sexually active  Musculoskeletal: Negative for myalgias.  Neurological: Positive for light-headedness and headaches (Mild without focal neurologic symptoms. No visual change). Negative for seizures and syncope.  Hematological: Negative for adenopathy.  Psychiatric/Behavioral: Negative for confusion.    Allergies  Penicillins  Home Medications   Current Outpatient Rx  Name  Route  Sig  Dispense  Refill  . amitriptyline (ELAVIL) 100 MG tablet   Oral   Take 1 tablet (100 mg total) by mouth at bedtime.   30 tablet   5   . atenolol (TENORMIN) 100 MG tablet   Oral   Take 1 tablet (100 mg total) by mouth daily.   30  tablet   5   . citalopram (CELEXA) 20 MG tablet   Oral   Take 1 tablet (20 mg total) by mouth daily.   30 tablet   5   . Eflornithine HCl 13.9 % cream      Use at bedtime once a day.   45 g   1   . levothyroxine (SYNTHROID, LEVOTHROID) 175 MCG tablet   Oral   Take 1 tablet (175 mcg total) by mouth daily before breakfast.   30 tablet   0   . LORazepam (ATIVAN) 0.5 MG tablet   Oral   Take 1 tablet (0.5 mg total) by mouth at bedtime as needed for anxiety.   30 tablet   0     Trinita will need an appointment before more refi ...    . omeprazole (PRILOSEC) 40 MG capsule   Oral   Take 1 capsule (40 mg total) by mouth daily.   30 capsule   5     Needs appointment   . ondansetron (ZOFRAN-ODT) 4 MG disintegrating tablet   Oral   Take 1 tablet (4 mg total) by mouth every 8 (eight) hours as needed for nausea.   20 tablet   0   . spironolactone (ALDACTONE) 25 MG tablet   Oral   Take 1 tablet (25 mg total) by mouth daily.   30 tablet   5   . SPRINTEC 28 0.25-35 MG-MCG tablet      TAKE ONE TABLET BY MOUTH ONE TIME DAILY   28 tablet   3    BP 137/90  Pulse 112  Temp(Src) 98.2 F (36.8 C) (Oral)  Resp 18  SpO2 98% Physical Exam  Nursing note and vitals reviewed. Constitutional: She is oriented to person, place, and time. She appears well-developed and well-nourished. No distress.  Appears fatigued,mildly toxic, skin warm and clammy, with dry heaves. very uncomfortable but no acute cardiorespiratory distress. Alert, cooperative.  HENT:  Head: Normocephalic and atraumatic.  Right Ear: External ear normal.  Left Ear: External ear normal.  Nose: Nose normal.  Mouth/Throat: Uvula is midline.  Oropharynx clear. Oral mucous membranes mildly dry. No lesions.  Eyes: Pupils are equal, round, and reactive to light. No scleral icterus.  Neck: Normal range of motion. Neck supple. No JVD present.  Cardiovascular: Regular rhythm, S1 normal, S2 normal and normal heart sounds.  Tachycardia present.  Exam reveals no gallop, no S3 and no S4.   No murmur heard. Pulmonary/Chest: Effort normal and breath sounds normal.  Abdominal: Soft. She exhibits no distension, no abdominal bruit and no mass. Bowel sounds are increased. There is no hepatosplenomegaly. There is no tenderness (Minimal,non-reproducible, diffuse tenderness). There is no rebound, no guarding and no CVA tenderness.  Musculoskeletal: Normal range of motion. She exhibits no edema and no tenderness.  Lymphadenopathy:    She has no cervical adenopathy.   Neurological: She is alert and oriented to person, place, and time. She has normal strength. No cranial nerve deficit or sensory deficit.  Skin: Skin is warm. No petechiae and no rash noted. She is diaphoretic.  Psychiatric: She has a normal mood and affect.    ED Course  Procedures (including critical care time) Labs Reviewed  COMPLETE METABOLIC PANEL WITH GFR - Abnormal; Notable for the following:    Glucose, Bld 164 (*)    Total Bilirubin 0.2 (*)    AST 95 (*)    ALT 47 (*)    All other components within normal limits  Narrative:    Performed at:  [facility]                [address]                [place], [place] [address]  TSH - Abnormal; Notable for the following:    TSH 42.903 (*)    All other components within normal limits   Narrative:    Performed at:  [facility]                [address]                [place], [place] [address]  POCT CBC W AUTO DIFF ([facility])   No results found. 1. Nausea with vomiting   2. Palpitations     EKG: sinus tachycardia, rate 126. No acute abnormalities. No ectopy.   MDM  Nausea and vomiting and palpitations and mild dehydration. CBC within normal limits, WBC 9.6 and hemoglobin 13.0. Likely from viral gastroenteritis. No evidence of acute surgical abdomen. No evidence of acute cardiorespiratory event. Need to rule out some other metabolic causes, as her levothyroxine dosage was changed to one month ago.-- Will check TSH and complete metabolic panel, and urged her to follow-up in two days with her PCP for recheck and to review lab results.  Other treatment today here in Urgent Care:  IV started and 1 Liter of normal saline given IV over 2 hours.  Phenergan 25 mg IM stat. She was reevaluated and vital signs significantly improved, as noted. BP normalized and pulse came down to the 110 range when checked by me. After the above treatment, she felt significantly improved. No  longer was sweating. Less fatigue. No abdominal pain. Her nausea had resolved. No orthostatic symptoms.  Will discharge her home with close follow-up with PCP in 2 days. Red flags discussed.-Written and verbal instructions given. Her PCP can evaluate when TSH and nonfasting complete metabolic panel is back. She may need adjustments in doses of her chronic medications, to be determined by PCP. Rx: Ondansetron (ZOFRAN-ODT) 4 MG disintegrating tablet. Take 1 tablet (4 mg total) by mouth every 8 (eight) hours as needed for nausea. She voiced understanding and agreement with above plans.   Lajean Manes, MD 12/24/12 [phone]

## 2012-12-24 ENCOUNTER — Telehealth: Payer: Self-pay | Admitting: *Deleted

## 2012-12-29 ENCOUNTER — Other Ambulatory Visit: Payer: Self-pay | Admitting: Physician Assistant

## 2013-02-07 ENCOUNTER — Other Ambulatory Visit: Payer: Self-pay | Admitting: Physician Assistant

## 2013-03-02 ENCOUNTER — Ambulatory Visit (INDEPENDENT_AMBULATORY_CARE_PROVIDER_SITE_OTHER): Payer: BC Managed Care – PPO | Admitting: Physician Assistant

## 2013-03-02 ENCOUNTER — Other Ambulatory Visit: Payer: Self-pay | Admitting: Physician Assistant

## 2013-03-02 ENCOUNTER — Encounter: Payer: Self-pay | Admitting: Physician Assistant

## 2013-03-02 VITALS — BP 144/90 | HR 121 | Wt 280.0 lb

## 2013-03-02 DIAGNOSIS — R42 Dizziness and giddiness: Secondary | ICD-10-CM

## 2013-03-02 DIAGNOSIS — R531 Weakness: Secondary | ICD-10-CM

## 2013-03-02 DIAGNOSIS — R5381 Other malaise: Secondary | ICD-10-CM

## 2013-03-02 DIAGNOSIS — R5383 Other fatigue: Secondary | ICD-10-CM

## 2013-03-02 DIAGNOSIS — E039 Hypothyroidism, unspecified: Secondary | ICD-10-CM

## 2013-03-02 LAB — CBC WITH DIFFERENTIAL/PLATELET
Eosinophils Absolute: 0.1 10*3/uL (ref 0.0–0.7)
Eosinophils Relative: 1 % (ref 0–5)
HCT: 37.8 % (ref 36.0–46.0)
Hemoglobin: 12.8 g/dL (ref 12.0–15.0)
Lymphs Abs: 2.9 10*3/uL (ref 0.7–4.0)
MCH: 30.3 pg (ref 26.0–34.0)
MCV: 89.6 fL (ref 78.0–100.0)
Monocytes Relative: 5 % (ref 3–12)
RBC: 4.22 MIL/uL (ref 3.87–5.11)

## 2013-03-02 LAB — COMPLETE METABOLIC PANEL WITH GFR
ALT: 22 U/L (ref 0–35)
AST: 35 U/L (ref 0–37)
Alkaline Phosphatase: 94 U/L (ref 39–117)
Chloride: 101 mEq/L (ref 96–112)
Creat: 0.82 mg/dL (ref 0.50–1.10)
Total Bilirubin: 0.2 mg/dL — ABNORMAL LOW (ref 0.3–1.2)

## 2013-03-02 MED ORDER — LEVOTHYROXINE SODIUM 200 MCG PO TABS: 200.0000 ug | ORAL_TABLET | Freq: Every day | ORAL | Status: DC

## 2013-03-02 NOTE — Progress Notes (Signed)
  Subjective:    Patient ID: Samantha Cortez, female    DOB: 22-May-1971, 42 y.o.   MRN: [id number]  HPI Patient is a 42 yo female who presents to the clinic with having dizzy spells. Her first dizzy spell was 2 months ago at work after standing from her office chair. She felt dizzy and then resolved quickly. Last week dizziness was much more intense. It happened in the shower. Her eyes went dark, she became nauseated and felt like she was going to pass out. She has never passed out but did sit in the floor until resolved. Both times her ears were ringing. Denies any CP or palpitations. She did take antivert and did seem like helped. She has felt weak and tired this weekend. She doesn't know how to describe it other than she feels "bad all over".     Review of Systems     Objective:   Physical Exam  Constitutional: She is oriented to person, place, and time. She appears well-nourished.  HENT:  Head: Normocephalic and atraumatic.  Right Ear: External ear normal.  Left Ear: External ear normal.  Nose: Nose normal.  Mouth/Throat: Oropharynx is clear and moist. No oropharyngeal exudate.  Eyes: Conjunctivae are normal. Right eye exhibits no discharge. Left eye exhibits no discharge.  Neck: Normal range of motion. Neck supple. No thyromegaly present.  Cardiovascular: Regular rhythm and normal heart sounds.   REchecked HR at 110.  Pulmonary/Chest: Effort normal and breath sounds normal.  Lymphadenopathy:    She has no cervical adenopathy.  Neurological: She is alert and oriented to person, place, and time.  Negative dix hallpike.  Rhombergs positive.   Skin: She is diaphoretic. There is pallor.  Psychiatric: She has a normal mood and affect. Her behavior is normal.          Assessment & Plan:  Dizzy spells/tachycardia/hypothyroidism/weakness/fatigue- orthostatic blood pressures are normal. She is very tachycardic. I am checking a CBC/ferritin to make sure not anemic or signs of  infection going on. No signs of infection either viral or bacterial today. Her TSH level was checked by UC but she never followed up and was 45 very elevated. Increased levothyroxine to 200mg  to take daily. I do think could be causing some of these symptoms. I did not feel a goiter or any thyroid enlargement today on exam. I am also concerned because per pt last CMP was fasting and glucose was elevated. I want to get another fasting glucose today. I want to make sure glucose issues are also not complicating symptoms. Discussed with patient to check HR and call if above 120 regularly. Rechecked today and pulse had decreased to 110. Continue taking BB.

## 2013-03-05 ENCOUNTER — Other Ambulatory Visit: Payer: Self-pay | Admitting: Physician Assistant

## 2013-03-05 MED ORDER — METFORMIN HCL 500 MG PO TABS: 500.0000 mg | ORAL_TABLET | Freq: Two times a day (BID) | ORAL | Status: DC

## 2013-03-07 ENCOUNTER — Other Ambulatory Visit: Payer: Self-pay | Admitting: Physician Assistant

## 2013-03-09 ENCOUNTER — Other Ambulatory Visit: Payer: Self-pay | Admitting: Physician Assistant

## 2013-03-09 MED ORDER — LORAZEPAM 0.5 MG PO TABS: 0.5000 mg | ORAL_TABLET | Freq: Every evening | ORAL | Status: DC | PRN

## 2013-03-11 ENCOUNTER — Other Ambulatory Visit: Payer: Self-pay | Admitting: Physician Assistant

## 2013-03-11 MED ORDER — LORAZEPAM 0.5 MG PO TABS: 0.5000 mg | ORAL_TABLET | Freq: Every evening | ORAL | Status: DC | PRN

## 2013-03-11 NOTE — Telephone Encounter (Signed)
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

## 2013-03-20 ENCOUNTER — Encounter: Payer: Self-pay | Admitting: Physician Assistant

## 2013-03-20 ENCOUNTER — Ambulatory Visit (INDEPENDENT_AMBULATORY_CARE_PROVIDER_SITE_OTHER): Payer: Commercial Managed Care - PPO | Admitting: Physician Assistant

## 2013-03-20 ENCOUNTER — Other Ambulatory Visit: Payer: Self-pay | Admitting: Physician Assistant

## 2013-03-20 VITALS — BP 103/69 | HR 99 | Wt 274.0 lb

## 2013-03-20 DIAGNOSIS — E119 Type 2 diabetes mellitus without complications: Secondary | ICD-10-CM | POA: Insufficient documentation

## 2013-03-20 DIAGNOSIS — G4700 Insomnia, unspecified: Secondary | ICD-10-CM

## 2013-03-20 DIAGNOSIS — Z23 Encounter for immunization: Secondary | ICD-10-CM

## 2013-03-20 DIAGNOSIS — E039 Hypothyroidism, unspecified: Secondary | ICD-10-CM

## 2013-03-20 LAB — TSH: TSH: 18.767 u[IU]/mL — ABNORMAL HIGH (ref 0.350–4.500)

## 2013-03-20 LAB — T4, FREE: Free T4: 1.35 ng/dL (ref 0.80–1.80)

## 2013-03-20 MED ORDER — LEVOTHYROXINE SODIUM 25 MCG PO TABS: 25.0000 ug | ORAL_TABLET | Freq: Every day | ORAL | Status: DC

## 2013-03-20 NOTE — Patient Instructions (Signed)
Increase elavil to 125mg  at bedtime. Can use lorazepam up to 1mg  (2 tabs). Try melatonin up to 10mg . Relaxation techniques.

## 2013-03-20 NOTE — Progress Notes (Signed)
  Subjective:    Patient ID: Samantha Cortez, female    DOB: 19-Apr-1971, 42 y.o.   MRN: [id number]  HPI Patient presents to the clinic to followup on hypothyroidism, diabetes mellitus, and insomnia.  Patient overall is feeling much better today. Patient has much more energy. She is not having the dizzy spells as she was before.  Her thyroid levels repair elevated at last visit. Her dose of Lipitor was increased. We'll recheck today.  Diabetes mellitus-it is too early to check an A1c on patient. Patient is responding very well to metformin twice a day. She denies any GI side effects. She has significantly changed her diet. She drinks sugary beverages and decreasing her carbs significantly. She is trying to become more active. She does walk a couple times a week. Denies any hypoglycemic events.  Insomnia is still ongoing. She was controlled on Elavil and Ativan as needed. For the last couple weeks her mind will not shut down at that time. She's tried changing her sleep routine. When she gets to sleep she has no problem staying asleep. She has not tried natural sleep aids.    Review of Systems     Objective:   Physical Exam  Constitutional: She is oriented to person, place, and time. She appears well-developed and well-nourished.  Obese.  HENT:  Head: Normocephalic and atraumatic.  Cardiovascular: Normal rate, regular rhythm and normal heart sounds.   Pulmonary/Chest: Effort normal and breath sounds normal. She has no wheezes.  Neurological: She is alert and oriented to person, place, and time.  Skin: Skin is warm and dry.  Psychiatric: She has a normal mood and affect. Her behavior is normal.          Assessment & Plan:   hypothyroidism-we'll check thyroid level today and see if this needs to be increased at all.  Diabetes mellitus type 2 controlled-not time for A1c at this point we'll check in December. Patient responding well to metformin. This should also help with weight loss.  Will consider increasing in the next month or so. Discuss diet changes with patient and how to better control diabetes. Flu shot given without complications.   Insomnia- increase Elavil to 125 mg at bedtime. Instructed patient she could also take 2 Ativan at bedtime if needed to help sleep. Encouraged adding melatonin to not time routine up to 10 mg. Followup in 2 months. Call if need refill on Elavil since we are increasing the dosage.

## 2013-03-26 ENCOUNTER — Encounter: Payer: Self-pay | Admitting: Physician Assistant

## 2013-03-27 ENCOUNTER — Other Ambulatory Visit: Payer: Self-pay | Admitting: Physician Assistant

## 2013-03-27 MED ORDER — LORAZEPAM 0.5 MG PO TABS: ORAL_TABLET | ORAL | Status: DC

## 2013-04-11 ENCOUNTER — Encounter: Payer: Self-pay | Admitting: Physician Assistant

## 2013-04-13 ENCOUNTER — Other Ambulatory Visit: Payer: Self-pay | Admitting: Physician Assistant

## 2013-04-13 MED ORDER — AMITRIPTYLINE HCL 150 MG PO TABS: 150.0000 mg | ORAL_TABLET | Freq: Every day | ORAL | Status: DC

## 2013-04-15 ENCOUNTER — Other Ambulatory Visit: Payer: Self-pay | Admitting: Physician Assistant

## 2013-04-15 MED ORDER — AMITRIPTYLINE HCL 150 MG PO TABS: 150.0000 mg | ORAL_TABLET | Freq: Every day | ORAL | Status: DC

## 2013-04-15 NOTE — Progress Notes (Signed)
Left information on vm

## 2013-04-15 NOTE — Progress Notes (Signed)
Let pt know elavil was sent. It was auto-pharmacy to [facility] not [facility] sorry.

## 2013-04-25 ENCOUNTER — Other Ambulatory Visit: Payer: Self-pay | Admitting: Physician Assistant

## 2013-05-17 ENCOUNTER — Other Ambulatory Visit: Payer: Self-pay | Admitting: Physician Assistant

## 2013-05-22 ENCOUNTER — Encounter: Payer: BC Managed Care – PPO | Admitting: Physician Assistant

## 2013-05-26 ENCOUNTER — Encounter: Payer: Commercial Managed Care - PPO | Admitting: Physician Assistant

## 2013-06-05 ENCOUNTER — Other Ambulatory Visit: Payer: Self-pay | Admitting: Physician Assistant

## 2013-06-12 ENCOUNTER — Encounter: Payer: Commercial Managed Care - PPO | Admitting: Physician Assistant

## 2013-06-12 DIAGNOSIS — Z0289 Encounter for other administrative examinations: Secondary | ICD-10-CM

## 2013-06-20 ENCOUNTER — Other Ambulatory Visit: Payer: Self-pay | Admitting: Physician Assistant

## 2013-07-01 ENCOUNTER — Other Ambulatory Visit: Payer: Self-pay | Admitting: Physician Assistant

## 2013-07-26 ENCOUNTER — Other Ambulatory Visit: Payer: Self-pay | Admitting: Physician Assistant

## 2013-07-27 ENCOUNTER — Other Ambulatory Visit: Payer: Self-pay | Admitting: Physician Assistant

## 2013-07-27 ENCOUNTER — Other Ambulatory Visit: Payer: Self-pay | Admitting: *Deleted

## 2013-07-27 MED ORDER — ATENOLOL 100 MG PO TABS: ORAL_TABLET | ORAL | Status: DC

## 2013-07-27 NOTE — Telephone Encounter (Signed)
Pt informed via email through MyChart that we can only send 7 days worth due to pt still not being seen.  Meyer CoryMisty Ahmad, LPN

## 2013-08-08 ENCOUNTER — Other Ambulatory Visit: Payer: Self-pay | Admitting: Physician Assistant

## 2013-08-12 ENCOUNTER — Encounter: Payer: Self-pay | Admitting: Physician Assistant

## 2013-08-14 ENCOUNTER — Other Ambulatory Visit: Payer: Self-pay | Admitting: Physician Assistant

## 2013-08-14 ENCOUNTER — Other Ambulatory Visit: Payer: Self-pay | Admitting: *Deleted

## 2013-08-14 DIAGNOSIS — F329 Major depressive disorder, single episode, unspecified: Secondary | ICD-10-CM

## 2013-08-14 DIAGNOSIS — F32A Depression, unspecified: Secondary | ICD-10-CM

## 2013-08-14 MED ORDER — AMBULATORY NON FORMULARY MEDICATION
Status: DC
Start: 2013-08-14 — End: 2013-08-14

## 2013-08-14 MED ORDER — AMBULATORY NON FORMULARY MEDICATION: Status: AC

## 2013-08-14 MED ORDER — AMBULATORY NON FORMULARY MEDICATION: Status: DC

## 2013-08-26 ENCOUNTER — Other Ambulatory Visit: Payer: Self-pay | Admitting: Physician Assistant

## 2013-08-26 NOTE — Telephone Encounter (Signed)
Pt hasn't been seen since 10/14.  Has HTN and diabetes.  Do you want to fill this.  Looks like an email was sent to have a referral to psych. Barry DienesKimberly Gordon, LPN

## 2013-08-27 ENCOUNTER — Other Ambulatory Visit: Payer: Self-pay | Admitting: Physician Assistant

## 2013-08-27 MED ORDER — LORAZEPAM 0.5 MG PO TABS
ORAL_TABLET | ORAL | Status: DC
Start: 2013-08-27 — End: 2013-10-01

## 2013-09-05 ENCOUNTER — Other Ambulatory Visit: Payer: Self-pay | Admitting: Physician Assistant

## 2013-09-06 ENCOUNTER — Other Ambulatory Visit: Payer: Self-pay | Admitting: Physician Assistant

## 2013-09-07 NOTE — Telephone Encounter (Signed)
Looks like last lab her med was increased. Can you look at this and confirm for me? Barry DienesKimberly Gordon, LPN

## 2013-09-07 NOTE — Telephone Encounter (Signed)
Yes, kim will only send 10. Pt needs lab recheck and appt.

## 2013-09-30 ENCOUNTER — Encounter: Payer: Self-pay | Admitting: Physician Assistant

## 2013-09-30 ENCOUNTER — Ambulatory Visit (INDEPENDENT_AMBULATORY_CARE_PROVIDER_SITE_OTHER): Payer: Commercial Managed Care - PPO | Admitting: Physician Assistant

## 2013-09-30 VITALS — BP 84/55 | HR 98 | Ht 61.5 in | Wt 277.0 lb

## 2013-09-30 DIAGNOSIS — E039 Hypothyroidism, unspecified: Secondary | ICD-10-CM

## 2013-09-30 DIAGNOSIS — F329 Major depressive disorder, single episode, unspecified: Secondary | ICD-10-CM

## 2013-09-30 DIAGNOSIS — E119 Type 2 diabetes mellitus without complications: Secondary | ICD-10-CM

## 2013-09-30 DIAGNOSIS — L83 Acanthosis nigricans: Secondary | ICD-10-CM

## 2013-09-30 DIAGNOSIS — F3289 Other specified depressive episodes: Secondary | ICD-10-CM

## 2013-09-30 DIAGNOSIS — G4700 Insomnia, unspecified: Secondary | ICD-10-CM

## 2013-09-30 DIAGNOSIS — F32A Depression, unspecified: Secondary | ICD-10-CM

## 2013-09-30 DIAGNOSIS — R031 Nonspecific low blood-pressure reading: Secondary | ICD-10-CM

## 2013-09-30 LAB — POCT UA - MICROALBUMIN
Creatinine, POC: 200 mg/dL
MICROALBUMIN (UR) POC: 80 mg/L

## 2013-09-30 LAB — POCT GLYCOSYLATED HEMOGLOBIN (HGB A1C): HEMOGLOBIN A1C: 6.7

## 2013-09-30 MED ORDER — LEVOMILNACIPRAN HCL ER 40 MG PO CP24: 1.0000 | ORAL_CAPSULE | Freq: Every day | ORAL | Status: DC

## 2013-09-30 MED ORDER — TRETINOIN 0.025 % EX CREA: TOPICAL_CREAM | Freq: Every day | CUTANEOUS | Status: DC

## 2013-09-30 MED ORDER — ATENOLOL 50 MG PO TABS: 50.0000 mg | ORAL_TABLET | Freq: Every day | ORAL | Status: DC

## 2013-09-30 NOTE — Patient Instructions (Signed)
Acanthosis Nigricans Acanthosis nigricans (AN) is a disorder that may begin at any age, including birth. It causes velvety, light brown, black, or grayish markings on the skin. They are usually found on the:  Face.  Neck.  Armpits.  Inner thighs.  Groin. AN can be noncancerous (benign) or associated with cancer (malignant). Most often, AN is a benign condition. Benign AN is primarily associated with being overweight. In young people, insulin resistance is the most common association with AN. Insulin is the hormone that controls your blood sugar. Insulin resistance occurs when the body does not use its insulin properly. Benign AN may cause social problems, since the person may appear as if he or she has poor hygiene.  CAUSES  Some people are born with AN. It is sometimes caused by a hormonal or glandular disorder, such as diabetes. Eating too much of the wrong foods, especially starches and sugars, raises insulin levels. Most patients with AN have a high insulin level. Increased insulin activates insulin receptors in the skin and forces them to grow abnormally. This may help cause AN. Reducing insulin by a special diet can lead to a rapid improvement of the skin problem. Both sexes are affected equally. Rarely, AN is associated with a tumor. The type of AN associated with malignancy more often occurs in elderly people. However, cases have been reported in children with a rare kidney cancer called Wilms' tumor. Malignant AN affects all races equally. SYMPTOMS  AN usually does not cause symptoms. Most people who have AN are bothered primarily by its appearance. DIAGNOSIS  When AN develops in people who are not overweight, medical tests are often done to find the cause. When AN is associated with malignancy, it is unusually severe. In those cases, AN can be seen in additional places, such as the lips or hands. AN associated with malignancy is linked to major problems because it is caused by the  presence of a cancer. The tumor is often aggressive and destructive. Benign AN has a good outcome. It is easily treated with good results. TREATMENT   Treatment to improve the appearance of AN includes prescription medicines (retinoids, 20% urea, alpha hydroxy acids, salicylic acid).  If overweight, avoiding starchy foods and sugars that raise the insulin level can help. Losing weight will also help decrease the appearance of AN tremendously.  Oral medicines are available that help decrease high insulin. HOME CARE INSTRUCTIONS   If you are overweight, exercise and watch your diet to lose the extra weight.  Use medicines prescribed by your caregiver as instructed. SEEK MEDICAL CARE IF:  You develop an unexplained case of AN in adulthood. Document Released: 05/21/2005 Document Revised: 08/13/2011 Document Reviewed: 09/08/2009 [facility] Patient Information 2014 [place], [place].

## 2013-10-01 ENCOUNTER — Other Ambulatory Visit: Payer: Self-pay | Admitting: Family Medicine

## 2013-10-01 ENCOUNTER — Other Ambulatory Visit: Payer: Self-pay | Admitting: Physician Assistant

## 2013-10-01 LAB — COMPLETE METABOLIC PANEL WITH GFR
ALT: 24 U/L (ref 0–35)
AST: 41 U/L — ABNORMAL HIGH (ref 0–37)
Albumin: 4.1 g/dL (ref 3.5–5.2)
Alkaline Phosphatase: 117 U/L (ref 39–117)
BUN: 10 mg/dL (ref 6–23)
CHLORIDE: 103 meq/L (ref 96–112)
CO2: 24 meq/L (ref 19–32)
CREATININE: 0.76 mg/dL (ref 0.50–1.10)
Calcium: 10 mg/dL (ref 8.4–10.5)
GFR, Est African American: >89 mL/min
GFR, Est Non African American: >89 mL/min
Glucose, Bld: 101 mg/dL — ABNORMAL HIGH (ref 70–99)
Potassium: 4.7 mEq/L (ref 3.5–5.3)
Sodium: 137 mEq/L (ref 135–145)
Total Bilirubin: 0.3 mg/dL (ref 0.2–1.2)
Total Protein: 7.7 g/dL (ref 6.0–8.3)

## 2013-10-01 LAB — TSH: TSH: 25.06 u[IU]/mL — ABNORMAL HIGH (ref 0.350–4.500)

## 2013-10-01 LAB — T4, FREE: FREE T4: 1.15 ng/dL (ref 0.80–1.80)

## 2013-10-02 ENCOUNTER — Telehealth: Payer: Self-pay | Admitting: *Deleted

## 2013-10-02 DIAGNOSIS — L83 Acanthosis nigricans: Secondary | ICD-10-CM | POA: Insufficient documentation

## 2013-10-02 NOTE — Telephone Encounter (Signed)
LM for pt to return call. Barry DienesKimberly Gordon, LPN

## 2013-10-02 NOTE — Telephone Encounter (Signed)
Stop and stay on celexa. Once symptoms resolve call back and will consider another medication to help with depression.

## 2013-10-02 NOTE — Progress Notes (Addendum)
   Subjective:    Patient ID: Samantha HammondJennifer B Accardo, female    DOB: 03/04/1971, 43 y.o.   MRN: [id number]  HPI Pt presents to the to the clinic to get refills on medication. Pt has not had insurance for last 6 months and we have just being refilling medication.   HTN- pt is taking atenolol 100mg  daily. She has been feeling worn out lately. She has also felt dizzy when she stands. Denies any CP, palpitations, headaches or vision changes.   hypothyroidisim- continues to take 225mcg daily. Needs to recheck labs.   DM- not been checking glucose at all. Continues to take glucocophage twice a day. No hypoglyemic events. No unhealed wounds. No neuropathy. Admits to not eating right and not exercising.   Rash- pt has noticed a dark rash around her neck. Not tried anything to make better. Seems to be getting darker. Does not itch or hurt.   Depression- pt feels more and more down. She will randomly call into work. She has no motivation. She does not want to get out of bed. She denies sucidal or homicidal thoughts. She is not exercising or eating right.    Review of Systems     Objective:   Physical Exam  Constitutional: She is oriented to person, place, and time. She appears well-developed and well-nourished.  Obese.   HENT:  Head: Normocephalic and atraumatic.  Neck:    Cardiovascular: Normal rate, regular rhythm and normal heart sounds.   Pulmonary/Chest: Effort normal and breath sounds normal. She has no wheezes.  Neurological: She is alert and oriented to person, place, and time.  Psychiatric: She has a normal mood and affect. Her behavior is normal.          Assessment & Plan:  HTN/low BP reading- decreased atenolol to 50mg  daily. Recheck in 4 wks.   Hypothyroidism- will recheck TSH today.   DM-.. Lab Results  Component Value Date   HGBA1C 6.7 09/30/2013   Controlled today. Continue on metformin. Nothing added today. Continue to work on diet and exercise. mircoalbumin elevated  will recheck at next visit.  Follow up in 3 months.    Acanthosis nigraines- discussed rash. Gave retin a to try once daily to get rid of rash. Follow up as needed. HO given. Weight loss and diabetic control is best treatment.   Depression- PhQ-9 was 18. I feel like pt will be a good candidate for fetizema. Gave samples, rx card and prescription. Discussed SE. Call if any worsening symptoms.   Insomnia- elavil refilled as well as ativan. Hopefully with more depression control sleep will improve.

## 2013-10-02 NOTE — Telephone Encounter (Signed)
Pt informed.  Misty Ahmad, LPN  

## 2013-10-02 NOTE — Telephone Encounter (Signed)
Pt calls and states [facility] And every since she has started it she has felt anxious, heart racing, cold, diarrhea and just doesn't feel right.  Please advise

## 2013-10-06 ENCOUNTER — Other Ambulatory Visit: Payer: Self-pay | Admitting: Physician Assistant

## 2013-10-06 DIAGNOSIS — E039 Hypothyroidism, unspecified: Secondary | ICD-10-CM

## 2013-10-09 ENCOUNTER — Encounter: Payer: Self-pay | Admitting: Physician Assistant

## 2013-10-09 ENCOUNTER — Other Ambulatory Visit: Payer: Self-pay | Admitting: Physician Assistant

## 2013-10-09 ENCOUNTER — Other Ambulatory Visit: Payer: Self-pay | Admitting: *Deleted

## 2013-10-09 MED ORDER — CITALOPRAM HYDROBROMIDE 40 MG PO TABS: 40.0000 mg | ORAL_TABLET | Freq: Every day | ORAL | Status: DC

## 2013-10-09 MED ORDER — LEVOTHYROXINE SODIUM 25 MCG PO TABS: ORAL_TABLET | ORAL | Status: DC

## 2013-10-09 MED ORDER — PANTOPRAZOLE SODIUM 40 MG PO TBEC: 40.0000 mg | DELAYED_RELEASE_TABLET | Freq: Every day | ORAL | Status: DC

## 2013-10-09 MED ORDER — LEVOTHYROXINE SODIUM 200 MCG PO TABS: ORAL_TABLET | ORAL | Status: DC

## 2013-10-09 NOTE — Progress Notes (Unsigned)
Call pt: Omeprazole cannot be taken with celexa over 20mg . Will send another PPI protonix for acid reflux.    Left detailed message and asked patient to return call. Milas KocherAngela H. Earna Coderuttle, [place]RMA 10/09/2013  Left detailed message for patient to call back. Milas KocherAngela H. Earna Coderuttle, [place]MA 10/12/2013  Left detailed message for patient to call back. Milas KocherAngela H. [place]uttle, [place]MA 10/28/2013

## 2013-10-09 NOTE — Telephone Encounter (Signed)
Thyroid medication resent due to sending it to wrong pharmacy. Called wrong pharmacy to cancel rx there.  Meyer CoryMisty Ahmad, LPN

## 2013-10-11 ENCOUNTER — Other Ambulatory Visit: Payer: Self-pay | Admitting: Physician Assistant

## 2013-10-24 ENCOUNTER — Other Ambulatory Visit: Payer: Self-pay | Admitting: Physician Assistant

## 2013-10-27 ENCOUNTER — Encounter: Payer: Self-pay | Admitting: Physician Assistant

## 2013-10-28 ENCOUNTER — Ambulatory Visit (INDEPENDENT_AMBULATORY_CARE_PROVIDER_SITE_OTHER): Payer: Commercial Managed Care - PPO | Admitting: Physician Assistant

## 2013-10-28 ENCOUNTER — Encounter: Payer: Self-pay | Admitting: Physician Assistant

## 2013-10-28 VITALS — BP 104/66 | HR 92 | Ht 61.5 in | Wt 279.0 lb

## 2013-10-28 DIAGNOSIS — L83 Acanthosis nigricans: Secondary | ICD-10-CM

## 2013-10-28 DIAGNOSIS — F32A Depression, unspecified: Secondary | ICD-10-CM

## 2013-10-28 DIAGNOSIS — Z1239 Encounter for other screening for malignant neoplasm of breast: Secondary | ICD-10-CM

## 2013-10-28 DIAGNOSIS — I10 Essential (primary) hypertension: Secondary | ICD-10-CM

## 2013-10-28 DIAGNOSIS — F329 Major depressive disorder, single episode, unspecified: Secondary | ICD-10-CM

## 2013-10-28 DIAGNOSIS — F3289 Other specified depressive episodes: Secondary | ICD-10-CM

## 2013-10-28 DIAGNOSIS — R42 Dizziness and giddiness: Secondary | ICD-10-CM

## 2013-10-28 DIAGNOSIS — E039 Hypothyroidism, unspecified: Secondary | ICD-10-CM

## 2013-10-28 MED ORDER — AMITRIPTYLINE HCL 50 MG PO TABS: ORAL_TABLET | ORAL | Status: DC

## 2013-10-28 MED ORDER — NORGESTIMATE-ETH ESTRADIOL 0.25-35 MG-MCG PO TABS: ORAL_TABLET | ORAL | Status: DC

## 2013-10-28 MED ORDER — VORTIOXETINE HBR 10 MG PO TABS: ORAL_TABLET | ORAL | Status: DC

## 2013-10-28 MED ORDER — LORAZEPAM 0.5 MG PO TABS: ORAL_TABLET | ORAL | Status: DC

## 2013-10-28 MED ORDER — TRETINOIN 0.1 % EX CREA: TOPICAL_CREAM | Freq: Every day | CUTANEOUS | Status: DC

## 2013-10-28 NOTE — Patient Instructions (Signed)
Will get mammogram.  Schedule pap smear.   Start Brintellix. Stop celexa.  Follow up in 6 weeks.

## 2013-10-31 ENCOUNTER — Other Ambulatory Visit: Payer: Self-pay | Admitting: Physician Assistant

## 2013-11-01 NOTE — Progress Notes (Signed)
   Subjective:    Patient ID: Samantha Cortez, female    DOB: 22-Mar-1971, 43 y.o.   MRN: [id number]  HPI Pt presents to the clinic for med refills.   Depression- she did not like fetizema."it made her feel funny". She felt "out of control, jittery". She stopped and went back to celexa. Continues to feel down and not motivated. No sucidal thoughts. Not exercising.   Acanthosis nigricans- retin was going to be 200 dollars. Could not afford. Wants something elese.   Dizziness- resolved since going to one half tablet of atenolol.   Hypothyroidism- labs still not correcting but admits to not taking levothyroxine on a empty stomach.    Review of Systems  All other systems reviewed and are negative.      Objective:   Physical Exam  Constitutional: She is oriented to person, place, and time. She appears well-developed and well-nourished.  Obese.   HENT:  Head: Normocephalic and atraumatic.  Cardiovascular: Normal rate, regular rhythm and normal heart sounds.   Pulmonary/Chest: Effort normal and breath sounds normal. She has no wheezes.  Neurological: She is alert and oriented to person, place, and time.  Psychiatric: She has a normal mood and affect. Her behavior is normal.          Assessment & Plan:   Depression- Stop celexa. Try Brintellix. Discussed side effects. If makes symptoms worse stop and call office.   Acanthosis nigracans- will try writing for generic retin to see if cheaper to try.   Dizziness- resolved with decrease of atenolol to 1/2 tablet.  Hypothyroidism- see is being seen by endocrinologist due to TSH levels continueing to be elevated despite high doses of levothyroxine. She was not taking on an empty stomach. She is going to try this and continue to follow up with endocrinlogist.     Ordered mammogram.  Needs to schedule pap smear.

## 2013-11-25 ENCOUNTER — Ambulatory Visit: Payer: Commercial Managed Care - PPO | Admitting: Physician Assistant

## 2013-12-04 ENCOUNTER — Other Ambulatory Visit: Payer: Self-pay | Admitting: Physician Assistant

## 2013-12-19 ENCOUNTER — Encounter: Payer: Self-pay | Admitting: Physician Assistant

## 2013-12-22 ENCOUNTER — Other Ambulatory Visit: Payer: Self-pay | Admitting: Physician Assistant

## 2013-12-22 MED ORDER — EFLORNITHINE HCL 13.9 % EX CREA
TOPICAL_CREAM | CUTANEOUS | Status: DC
Start: 2013-12-22 — End: 2013-12-24

## 2013-12-24 ENCOUNTER — Other Ambulatory Visit: Payer: Self-pay | Admitting: *Deleted

## 2013-12-24 MED ORDER — EFLORNITHINE HCL 13.9 % EX CREA: TOPICAL_CREAM | CUTANEOUS | Status: AC

## 2013-12-26 ENCOUNTER — Other Ambulatory Visit: Payer: Self-pay | Admitting: Physician Assistant

## 2014-01-08 ENCOUNTER — Encounter: Payer: Self-pay | Admitting: Physician Assistant

## 2014-01-08 ENCOUNTER — Ambulatory Visit (INDEPENDENT_AMBULATORY_CARE_PROVIDER_SITE_OTHER): Payer: Commercial Managed Care - PPO | Admitting: Physician Assistant

## 2014-01-08 VITALS — BP 111/64 | HR 111 | Wt 280.0 lb

## 2014-01-08 DIAGNOSIS — F32A Depression, unspecified: Secondary | ICD-10-CM

## 2014-01-08 DIAGNOSIS — F329 Major depressive disorder, single episode, unspecified: Secondary | ICD-10-CM

## 2014-01-08 DIAGNOSIS — F3289 Other specified depressive episodes: Secondary | ICD-10-CM

## 2014-01-08 MED ORDER — BUPROPION HCL ER (XL) 150 MG PO TB24: ORAL_TABLET | ORAL | Status: DC

## 2014-01-08 MED ORDER — ESCITALOPRAM OXALATE 20 MG PO TABS: 20.0000 mg | ORAL_TABLET | Freq: Every day | ORAL | Status: DC

## 2014-01-11 NOTE — Progress Notes (Signed)
   Subjective:    Patient ID: Samantha Cortez, female    DOB: 06/25/1970, 43 y.o.   MRN: [id number]  HPI Pt comes in today to follow up on depression. She was started on brintellix over 2 months ago. Pt had great response. She "felt the best she had in years"; however, she cannot afford. She has a huge prescription deductible and coupon card only took 30 dollars off. She did not qualifty for pt assisance with drug company. She went back on celexa. Her depression is much worse. She feels very down, her sleep is off and on, she is crying more and no energy. Working is difficult. She has no drive. No sucidial or homicidal thoughts. She has tried a Economistlong list of SSRI/SSNRI: lexapro, prozac, celexa, effexor, zoloft, paxil and even cymbalta.    Review of Systems  All other systems reviewed and are negative.      Objective:   Physical Exam  Constitutional: She is oriented to person, place, and time. She appears well-developed and well-nourished.  Obese.   HENT:  Head: Normocephalic and atraumatic.  Cardiovascular: Regular rhythm and normal heart sounds.   Tachycardia at 111.   Pulmonary/Chest: Effort normal and breath sounds normal. She has no wheezes.  Neurological: She is alert and oriented to person, place, and time.  Skin: Skin is dry.  Psychiatric:  Emotional mood. Frustrated that she found something that worked but cannot afford.           Assessment & Plan:  Depression- PHQ-9 was 18.this is after being off of brintellix for 1 month. She got tremendous benefit on brintellix.   Unfortunately we are at a bad place she has a huge deductible and coupon card only takes 30 dollars off. She started taking her celexa again. We will have to find another medication but no branded meds are going to be affordable. She has tried most generic SSRI with no signifcant improvement. She had best response with lexapro. Will put lexapro and wellbutrin together and see how pt's does. Follow up in 4 weeks.  Discussed worsening depression to call office.    Discussed looking for private plan for prescription coverage since her plan renews in October.   Spent 30 minutes with patient and greater than 50 percent of visit spent counseling pt regarding depression.

## 2014-01-17 ENCOUNTER — Other Ambulatory Visit: Payer: Self-pay | Admitting: Physician Assistant

## 2014-01-20 ENCOUNTER — Ambulatory Visit (INDEPENDENT_AMBULATORY_CARE_PROVIDER_SITE_OTHER): Payer: Commercial Managed Care - PPO | Admitting: Physician Assistant

## 2014-01-20 ENCOUNTER — Encounter: Payer: Self-pay | Admitting: Physician Assistant

## 2014-01-20 VITALS — BP 122/92 | HR 128 | Wt 279.0 lb

## 2014-01-20 DIAGNOSIS — R112 Nausea with vomiting, unspecified: Secondary | ICD-10-CM

## 2014-01-20 DIAGNOSIS — R1013 Epigastric pain: Secondary | ICD-10-CM

## 2014-01-20 DIAGNOSIS — R110 Nausea: Secondary | ICD-10-CM

## 2014-01-20 LAB — CBC WITH DIFFERENTIAL/PLATELET
Basophils Absolute: 0 10*3/uL (ref 0.0–0.1)
Basophils Relative: 0 % (ref 0–1)
EOS ABS: 0.1 10*3/uL (ref 0.0–0.7)
Eosinophils Relative: 1 % (ref 0–5)
HCT: 39.6 % (ref 36.0–46.0)
Hemoglobin: 12.9 g/dL (ref 12.0–15.0)
Lymphocytes Relative: 25 % (ref 12–46)
Lymphs Abs: 2.5 10*3/uL (ref 0.7–4.0)
MCH: 30 pg (ref 26.0–34.0)
MCHC: 32.6 g/dL (ref 30.0–36.0)
MCV: 92.1 fL (ref 78.0–100.0)
Monocytes Absolute: 0.5 10*3/uL (ref 0.1–1.0)
Monocytes Relative: 5 % (ref 3–12)
NEUTROS PCT: 69 % (ref 43–77)
Neutro Abs: 6.8 10*3/uL (ref 1.7–7.7)
PLATELETS: 452 10*3/uL — AB (ref 150–400)
RBC: 4.3 MIL/uL (ref 3.87–5.11)
RDW: 15 % (ref 11.5–15.5)
WBC: 9.9 10*3/uL (ref 4.0–10.5)

## 2014-01-20 LAB — COMPLETE METABOLIC PANEL WITH GFR
ALK PHOS: 129 U/L — AB (ref 39–117)
ALT: 49 U/L — ABNORMAL HIGH (ref 0–35)
AST: 79 U/L — AB (ref 0–37)
Albumin: 4.3 g/dL (ref 3.5–5.2)
BILIRUBIN TOTAL: 0.3 mg/dL (ref 0.2–1.2)
BUN: 10 mg/dL (ref 6–23)
CO2: 22 mEq/L (ref 19–32)
CREATININE: 0.77 mg/dL (ref 0.50–1.10)
Calcium: 8.9 mg/dL (ref 8.4–10.5)
Chloride: 102 mEq/L (ref 96–112)
GFR, Est African American: >89 mL/min
GFR, Est Non African American: >89 mL/min
Glucose, Bld: 189 mg/dL — ABNORMAL HIGH (ref 70–99)
Potassium: 4.8 mEq/L (ref 3.5–5.3)
Sodium: 137 mEq/L (ref 135–145)
Total Protein: 8 g/dL (ref 6.0–8.3)

## 2014-01-20 LAB — LIPASE: Lipase: 19 U/L (ref 0–75)

## 2014-01-20 LAB — AMYLASE: Amylase: 28 U/L (ref 0–105)

## 2014-01-20 MED ORDER — PROMETHAZINE HCL 25 MG/ML IJ SOLN
12.5000 mg | Freq: Once | INTRAMUSCULAR | Status: AC
  Administered 2014-01-20: 12.5 mg via INTRAMUSCULAR

## 2014-01-20 MED ORDER — PROMETHAZINE HCL 25 MG PO TABS: 25.0000 mg | ORAL_TABLET | Freq: Four times a day (QID) | ORAL | Status: AC | PRN

## 2014-01-20 MED ORDER — SUCRALFATE 1 G PO TABS: 1.0000 g | ORAL_TABLET | Freq: Four times a day (QID) | ORAL | Status: DC

## 2014-01-20 NOTE — Progress Notes (Signed)
   Subjective:    Patient ID: Samantha HammondJennifer B Crear, female    DOB: 02/26/1971, 43 y.o.   MRN: [id number]  HPI Pt presents to the clinic with 2 days of epigastric pain. Started early Monday morning with burning in stomach. She was very nauseated and vomiting a couple of times. She has no appetitie and has not really eaten since Monday. She continues to drink. Denies any blood in stool or black tarry stools. She denies eating anything different. No diarrhea. Pain is 7/10 but does not radiate. She has had gallbladder sludge and fatty liver found on previous ultrasound. She is on protonix daily. She admits to taking some NSAIDs over the past 2 weeks.      Review of Systems  All other systems reviewed and are negative.      Objective:   Physical Exam  Constitutional: She is oriented to person, place, and time.  HENT:  Head: Normocephalic and atraumatic.  Cardiovascular: Regular rhythm and normal heart sounds.   Tachycardia at 128 initially. Rechecked at 115.  Pulmonary/Chest: Effort normal and breath sounds normal.  Abdominal:  Guarding and tenderness over epigastric area.  Negative murphys sign.  No CVA tenderness.   Neurological: She is alert and oriented to person, place, and time.  Skin: She is diaphoretic.  Psychiatric: She has a normal mood and affect. Her behavior is normal.          Assessment & Plan:  Epigastric pain/nausea/vomiting/tachycardia- IM phenergan given 12.5mg . She is driving so did not give full dose. GI cocktail given in office with some reduction of pain. Suspect gastritis. Will order CBC, CMP, amylase, lipase and h.pylori. Added carafate 4 times a day to protonix. Stop all NSAIDs. Oral phenergan given for nausea. Follow up if no improving or worsening. Pt appears to be slightly dehydrated. Increase fluids with water and gaterade. Advanced food as tolerated BRAT diet.  If not able to keep down needs to go to ER for fluids.

## 2014-01-20 NOTE — Patient Instructions (Signed)

## 2014-01-21 LAB — H. PYLORI ANTIBODY, IGG: H PYLORI IGG: 0.97 {ISR} — AB

## 2014-01-21 MED ORDER — GI COCKTAIL ~~LOC~~
30.0000 mL | Freq: Once | ORAL | Status: AC
  Administered 2014-01-20: 30 mL via ORAL

## 2014-01-21 NOTE — Addendum Note (Signed)
Addended by: Chalmers CaterUTTLE, Aesha Agrawal H on: 01/21/2014 02:05 PM   Modules accepted: Orders

## 2014-01-25 ENCOUNTER — Other Ambulatory Visit: Payer: Self-pay | Admitting: Physician Assistant

## 2014-02-01 ENCOUNTER — Other Ambulatory Visit: Payer: Self-pay | Admitting: Physician Assistant

## 2014-02-23 ENCOUNTER — Other Ambulatory Visit: Payer: Self-pay | Admitting: Physician Assistant

## 2014-02-24 MED ORDER — LORAZEPAM 0.5 MG PO TABS: ORAL_TABLET | ORAL | Status: DC

## 2014-02-24 MED ORDER — AMITRIPTYLINE HCL 50 MG PO TABS: ORAL_TABLET | ORAL | Status: DC

## 2014-03-02 ENCOUNTER — Encounter: Payer: Self-pay | Admitting: Physician Assistant

## 2014-03-03 ENCOUNTER — Other Ambulatory Visit: Payer: Self-pay

## 2014-03-03 MED ORDER — ESCITALOPRAM OXALATE 20 MG PO TABS
20.0000 mg | ORAL_TABLET | Freq: Every day | ORAL | Status: DC
Start: 2014-03-03 — End: 2014-07-06

## 2014-03-10 ENCOUNTER — Encounter: Payer: Commercial Managed Care - PPO | Admitting: Physician Assistant

## 2014-03-10 NOTE — Telephone Encounter (Signed)
Call pt: let her know she missed appt and importance of follow up.

## 2014-03-10 NOTE — Progress Notes (Signed)
This encounter was created in error - please disregard.

## 2014-03-24 ENCOUNTER — Other Ambulatory Visit: Payer: Self-pay | Admitting: Physician Assistant

## 2014-04-26 ENCOUNTER — Other Ambulatory Visit: Payer: Self-pay | Admitting: Family Medicine

## 2014-05-17 ENCOUNTER — Other Ambulatory Visit: Payer: Self-pay | Admitting: Physician Assistant

## 2014-05-17 ENCOUNTER — Encounter: Payer: Self-pay | Admitting: Physician Assistant

## 2014-05-17 MED ORDER — SUMATRIPTAN SUCCINATE 25 MG PO TABS: 25.0000 mg | ORAL_TABLET | ORAL | Status: DC | PRN

## 2014-05-30 ENCOUNTER — Other Ambulatory Visit: Payer: Self-pay | Admitting: Physician Assistant

## 2014-06-11 ENCOUNTER — Encounter: Payer: Self-pay | Admitting: Physician Assistant

## 2014-06-11 ENCOUNTER — Ambulatory Visit (INDEPENDENT_AMBULATORY_CARE_PROVIDER_SITE_OTHER): Payer: Commercial Managed Care - PPO | Admitting: Physician Assistant

## 2014-06-11 VITALS — BP 142/89 | HR 105 | Ht 61.5 in | Wt 268.0 lb

## 2014-06-11 DIAGNOSIS — S81001A Unspecified open wound, right knee, initial encounter: Secondary | ICD-10-CM

## 2014-06-11 DIAGNOSIS — S81801A Unspecified open wound, right lower leg, initial encounter: Secondary | ICD-10-CM

## 2014-06-11 DIAGNOSIS — S91001A Unspecified open wound, right ankle, initial encounter: Secondary | ICD-10-CM

## 2014-06-11 MED ORDER — TRAMADOL HCL 50 MG PO TABS: 50.0000 mg | ORAL_TABLET | Freq: Three times a day (TID) | ORAL | Status: DC | PRN

## 2014-06-11 MED ORDER — DOXYCYCLINE HYCLATE 100 MG PO TABS: 100.0000 mg | ORAL_TABLET | Freq: Two times a day (BID) | ORAL | Status: DC

## 2014-06-11 NOTE — Patient Instructions (Signed)
duoderm change in 3 days. Avoid getting wet.  Stop keflex.  Start doxycycline for 10 days.  Tramadol for pain.

## 2014-06-11 NOTE — Progress Notes (Signed)
   Subjective:    Patient ID: Samantha Cortez, female    DOB: 03/27/1971, 44 y.o.   MRN: [id number]  HPI Pt presents to the clinic with a wound to her right anterior ankle after fall 3 weeks ago. Started as just a scab but was not healing. Went to doctor at work and told her to use tegaderm and neosporin and to keep covered. Started on keflex on Monday. Very painful and throbbing. Would appears to have loose whitish skin on top and pt concerned. Denies any fever.     Review of Systems  All other systems reviewed and are negative.      Objective:   Physical Exam  Constitutional: She is oriented to person, place, and time. She appears well-developed and well-nourished.  Cardiovascular: Normal rate.   Neurological: She is alert and oriented to person, place, and time.  Skin:     Psychiatric: She has a normal mood and affect. Her behavior is normal.          Assessment & Plan:  Wound due to fall- i feel like wound has been over moisterized. Cleaned with hibiclens. Patted dry. Placed duoderm. Wrapped with coban. Change duoderm in 3 days. Follow up in 1 week for follow up.   Pt needs appt for DM recheck and medication refill.

## 2014-06-18 ENCOUNTER — Ambulatory Visit: Payer: Self-pay | Admitting: Physician Assistant

## 2014-06-21 ENCOUNTER — Ambulatory Visit: Payer: Self-pay | Admitting: Physician Assistant

## 2014-06-21 DIAGNOSIS — Z0289 Encounter for other administrative examinations: Secondary | ICD-10-CM

## 2014-06-27 ENCOUNTER — Other Ambulatory Visit: Payer: Self-pay | Admitting: Physician Assistant

## 2014-06-28 ENCOUNTER — Other Ambulatory Visit: Payer: Self-pay | Admitting: Physician Assistant

## 2014-06-29 MED ORDER — LORAZEPAM 0.5 MG PO TABS: ORAL_TABLET | ORAL | Status: DC

## 2014-07-03 ENCOUNTER — Other Ambulatory Visit: Payer: Self-pay | Admitting: Physician Assistant

## 2014-07-07 ENCOUNTER — Ambulatory Visit: Payer: Self-pay | Admitting: Physician Assistant

## 2014-07-09 ENCOUNTER — Ambulatory Visit: Payer: Self-pay | Admitting: Physician Assistant

## 2014-07-16 ENCOUNTER — Ambulatory Visit: Payer: Self-pay | Admitting: Physician Assistant

## 2014-07-16 ENCOUNTER — Encounter: Payer: Self-pay | Admitting: Physician Assistant

## 2014-07-16 ENCOUNTER — Ambulatory Visit: Payer: Commercial Managed Care - PPO | Admitting: Physician Assistant

## 2014-07-16 ENCOUNTER — Other Ambulatory Visit: Payer: Self-pay | Admitting: Physician Assistant

## 2014-07-21 ENCOUNTER — Other Ambulatory Visit: Payer: Self-pay | Admitting: Physician Assistant

## 2014-07-23 ENCOUNTER — Ambulatory Visit: Payer: Self-pay | Admitting: Physician Assistant

## 2014-07-23 ENCOUNTER — Other Ambulatory Visit: Payer: Self-pay | Admitting: Physician Assistant

## 2014-07-25 IMAGING — NM NM HEPATO W/GB/PHARM/[PERSON_NAME]
3 series · 13 of 13 positions shown · non-contrast
Comparison: Ultrasound 12/25/2011

CLINICAL DATA: Abdominal pain, right quadrant pain

NUCLEAR MEDICINE HEPATOBILIARY IMAGING WITH GALLBLADDER EF
TECHNIQUE: Sequential images of the abdomen were obtained [DATE]
minutes following intravenous administration of
radiopharmaceutical.  After slow intravenous infusion of 0.1 ucg
Cholecystokinin, gallbladder ejection fraction was determined.
Radiopharmaceutical:  5.0 mCi Sc-FFm Choletec

[he hepatobiliary · 3.43mm/px · 6 of 46 frames shown (1 of 3)]
[frame 4/46]
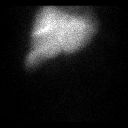
[frame 12/46]
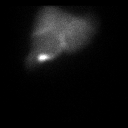
[frame 20/46]
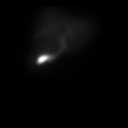
[frame 27/46]
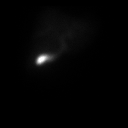
[frame 35/46]
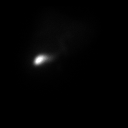
[frame 43/46]
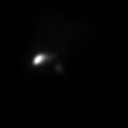

[he hepatobiliary · 1 of 1 slices shown (2 of 3)]
[im 1/1]
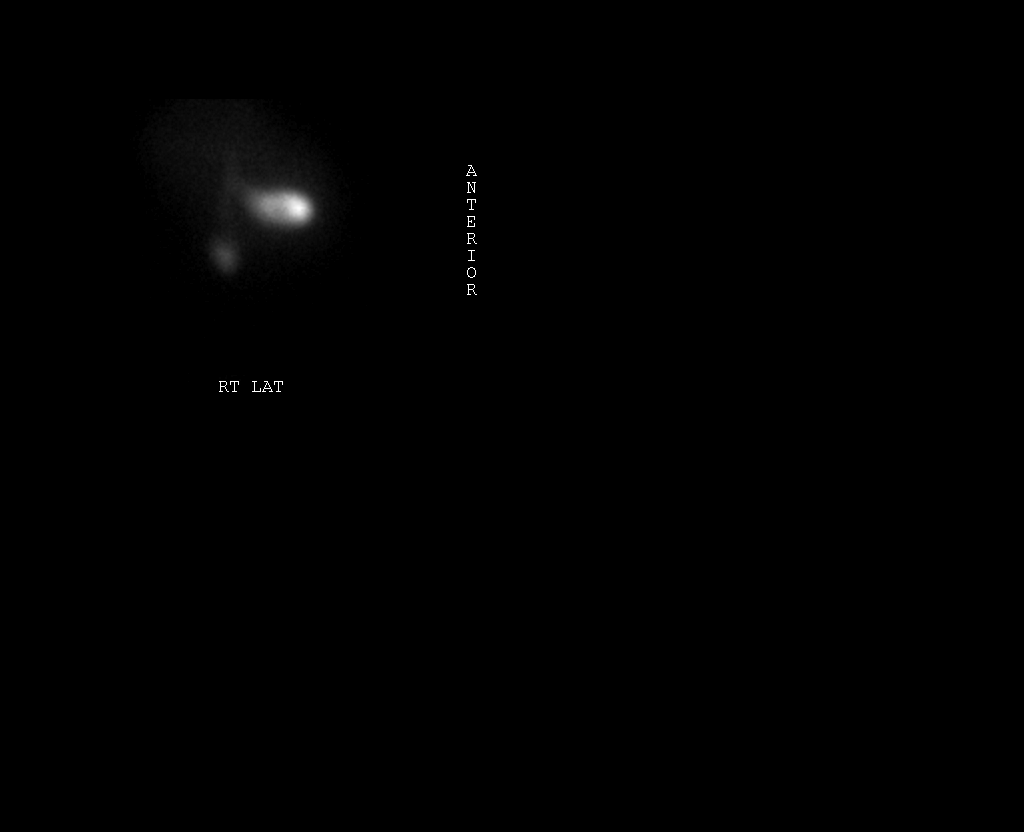

[he hepatobiliary · 3.43mm/px · 6 of 30 frames shown (3 of 3)]
[frame 3/30]
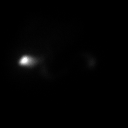
[frame 8/30]
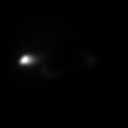
[frame 13/30]
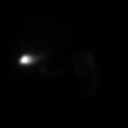
[frame 18/30]
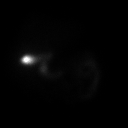
[frame 23/30]
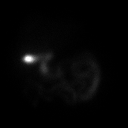
[frame 28/30]
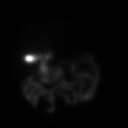

[13 of 13 positions shown; findings below may reference images not displayed]

FINDINGS: There is prompt extraction of radiotracer from the blood
pool and homogeneous uptake within the liver.  The gallbladder is
evident by 20 minutes.  Counts are not  present within the bowel by
60 minutes.  Upon administration of a cholecystokinin, the
gallbladder contracts appropriately with a calculated ejection
fraction = 73%  at 30 min (Normal greater than 30 % ejection)
IMPRESSION: 1. Normal gallbladder ejection fraction =  73%.
2. Patent cystic duct and common bile duct.

## 2014-07-30 ENCOUNTER — Encounter: Payer: Self-pay | Admitting: Physician Assistant

## 2014-07-30 ENCOUNTER — Ambulatory Visit (INDEPENDENT_AMBULATORY_CARE_PROVIDER_SITE_OTHER): Payer: Commercial Managed Care - PPO | Admitting: Physician Assistant

## 2014-07-30 VITALS — BP 130/81 | HR 116 | Wt 253.0 lb

## 2014-07-30 DIAGNOSIS — G4700 Insomnia, unspecified: Secondary | ICD-10-CM

## 2014-07-30 DIAGNOSIS — E282 Polycystic ovarian syndrome: Secondary | ICD-10-CM

## 2014-07-30 DIAGNOSIS — E039 Hypothyroidism, unspecified: Secondary | ICD-10-CM | POA: Insufficient documentation

## 2014-07-30 DIAGNOSIS — E119 Type 2 diabetes mellitus without complications: Secondary | ICD-10-CM | POA: Diagnosis not present

## 2014-07-30 DIAGNOSIS — R000 Tachycardia, unspecified: Secondary | ICD-10-CM | POA: Diagnosis not present

## 2014-07-30 MED ORDER — BUPROPION HCL ER (XL) 150 MG PO TB24: 150.0000 mg | ORAL_TABLET | Freq: Every day | ORAL | Status: DC

## 2014-07-30 MED ORDER — SUMATRIPTAN SUCCINATE 25 MG PO TABS: 25.0000 mg | ORAL_TABLET | ORAL | Status: AC | PRN

## 2014-07-30 MED ORDER — METOPROLOL SUCCINATE ER 25 MG PO TB24: 25.0000 mg | ORAL_TABLET | Freq: Every day | ORAL | Status: DC

## 2014-07-30 MED ORDER — METFORMIN HCL 500 MG PO TABS: ORAL_TABLET | ORAL | Status: DC

## 2014-07-30 MED ORDER — SPIRONOLACTONE 25 MG PO TABS: 25.0000 mg | ORAL_TABLET | Freq: Every day | ORAL | Status: DC

## 2014-07-30 MED ORDER — AMITRIPTYLINE HCL 100 MG PO TABS: ORAL_TABLET | ORAL | Status: DC

## 2014-07-30 MED ORDER — PANTOPRAZOLE SODIUM 40 MG PO TBEC: 40.0000 mg | DELAYED_RELEASE_TABLET | Freq: Every day | ORAL | Status: DC

## 2014-07-30 MED ORDER — ESCITALOPRAM OXALATE 20 MG PO TABS: 20.0000 mg | ORAL_TABLET | Freq: Every day | ORAL | Status: DC

## 2014-07-30 MED ORDER — LORAZEPAM 0.5 MG PO TABS: ORAL_TABLET | ORAL | Status: DC

## 2014-07-30 NOTE — Progress Notes (Signed)
   Subjective:    Patient ID: Samantha HammondJennifer B Cortez, female    DOB: 06/03/1971, 44 y.o.   MRN: [id number]  HPI  Pt presents to the clinic for medication refills.   Anxiety/Depression- not considered controlled but pt does feel somewhat stable. She is also having increased heart rate which she feels like is increasing her anxiety. She stopped atenolol because she felt like making her dizzy. Denies any sucidal thoughts. She just feels overwhelmed a lot.   Hypothyroidism- needs refills. Not been checked in a while.   DM, type II/PCOS- pt not had a1c or follow up. Not checking sugars at all. Just on metformin. No hypoglycemic events. Not exercising. Watching some carbs but no specific diet.      Review of Systems  All other systems reviewed and are negative.      Objective:   Physical Exam  Constitutional: She is oriented to person, place, and time. She appears well-developed and well-nourished.  HENT:  Head: Normocephalic and atraumatic.  Cardiovascular: Normal rate, regular rhythm and normal heart sounds.   Pulmonary/Chest: Effort normal and breath sounds normal.  Neurological: She is alert and oriented to person, place, and time.  Skin: Skin is dry.  Psychiatric: She has a normal mood and affect. Her behavior is normal.          Assessment & Plan:  Anxiety/Depression- PHQ-9 was 9 and GAD-7 was 7. Will add wellbutrin to lexapro and ativan as needed. Follow up in 4-6 weeks. Discussed side effects. cmp ordered.    Tachycardia- try metoprolol 25mg  daily. Recheck in 4 weeks. Discussed side effects. Could also have some anti-anxiety effects as well.   Hypothyroidism- labs need rechecked and adjusted accordingly.   PCOS/type II diabetes- pt needs diabetic follow up and maintence labs addressed. Discussed needs to make another appt for that. Will add a1c in blood work today.   Pt also needs a pap.

## 2014-07-31 LAB — COMPLETE METABOLIC PANEL WITH GFR
ALK PHOS: 192 U/L — AB (ref 39–117)
ALT: 69 U/L — AB (ref 0–35)
AST: 67 U/L — AB (ref 0–37)
Albumin: 4.1 g/dL (ref 3.5–5.2)
BUN: 8 mg/dL (ref 6–23)
CHLORIDE: 98 meq/L (ref 96–112)
CO2: 24 mEq/L (ref 19–32)
CREATININE: 0.66 mg/dL (ref 0.50–1.10)
Calcium: 9.7 mg/dL (ref 8.4–10.5)
GFR, Est African American: >89 mL/min
GFR, Est Non African American: >89 mL/min
GLUCOSE: 305 mg/dL — AB (ref 70–99)
Potassium: 4.4 mEq/L (ref 3.5–5.3)
Sodium: 133 mEq/L — ABNORMAL LOW (ref 135–145)
TOTAL PROTEIN: 7.7 g/dL (ref 6.0–8.3)
Total Bilirubin: 0.4 mg/dL (ref 0.2–1.2)

## 2014-07-31 LAB — HEMOGLOBIN A1C
Hgb A1c MFr Bld: 14 % — ABNORMAL HIGH (ref <5.7)
Mean Plasma Glucose: 355 mg/dL — ABNORMAL HIGH (ref <117)

## 2014-07-31 LAB — T4, FREE: Free T4: 1.23 ng/dL (ref 0.80–1.80)

## 2014-07-31 LAB — TSH: TSH: 18.89 u[IU]/mL — AB (ref 0.350–4.500)

## 2014-08-06 ENCOUNTER — Ambulatory Visit: Payer: Commercial Managed Care - PPO | Admitting: Physician Assistant

## 2014-08-08 ENCOUNTER — Other Ambulatory Visit: Payer: Self-pay | Admitting: Family Medicine

## 2014-08-09 ENCOUNTER — Telehealth: Payer: Self-pay

## 2014-08-09 ENCOUNTER — Other Ambulatory Visit: Payer: Self-pay | Admitting: *Deleted

## 2014-08-09 MED ORDER — LEVOTHYROXINE SODIUM 200 MCG PO TABS: ORAL_TABLET | ORAL | Status: DC

## 2014-08-09 MED ORDER — LEVOTHYROXINE SODIUM 25 MCG PO TABS: 25.0000 ug | ORAL_TABLET | Freq: Every day | ORAL | Status: DC

## 2014-08-09 NOTE — Telephone Encounter (Signed)
Samantha Cortez,  Samantha Cortez has an appointment scheduled with Samantha Cortez in March and Dr Samantha Cortez in April. Please check to see if this is correct. Thank you.

## 2014-08-13 ENCOUNTER — Ambulatory Visit: Payer: Commercial Managed Care - PPO | Admitting: Physician Assistant

## 2014-08-16 ENCOUNTER — Encounter: Payer: Self-pay | Admitting: Family Medicine

## 2014-08-16 ENCOUNTER — Ambulatory Visit (INDEPENDENT_AMBULATORY_CARE_PROVIDER_SITE_OTHER): Payer: Commercial Managed Care - PPO | Admitting: Family Medicine

## 2014-08-16 VITALS — BP 145/103 | HR 114 | Temp 97.7°F | Wt 251.0 lb

## 2014-08-16 DIAGNOSIS — M609 Myositis, unspecified: Secondary | ICD-10-CM

## 2014-08-16 DIAGNOSIS — IMO0001 Reserved for inherently not codable concepts without codable children: Secondary | ICD-10-CM

## 2014-08-16 DIAGNOSIS — M791 Myalgia: Secondary | ICD-10-CM | POA: Diagnosis not present

## 2014-08-16 LAB — POCT INFLUENZA A/B
Influenza A, POC: NEGATIVE
Influenza B, POC: NEGATIVE

## 2014-08-16 MED ORDER — MELOXICAM 15 MG PO TABS: 15.0000 mg | ORAL_TABLET | Freq: Every day | ORAL | Status: AC

## 2014-08-16 NOTE — Progress Notes (Signed)
CC: Samantha Cortez is a 44 y.o. female is here for Generalized Body Aches   Subjective: HPI:  Diffuse body aches that began this morning currently moderate in severity. Improves with getting up and moving around but only to a mild degree. No benefit from Tylenol. No other interventions as of yet. She's never had this before. She was in her regular state of health yesterday and when she woke up this morning she had a sore throat nausea fatigue and the above chief complaint. Nothing else seems to make symptoms better or worse. They've been persistent since onset. She denies any known sick contacts. If anything the sore throat is slightly improving. Denies cough, wheezing, chest pain, shortness of breath, rash, headache, motor or sensory disturbances other than that described above. Denies abdominal discomfort and diarrhea constipation or any genitourinary complaints. Review of symptoms positive for subjective fevers and chills all last night.  Review Of Systems Outlined In HPI  Past Medical History  Diagnosis Date  . Depression   . Thyroid disease   . History of kidney stones   . PCOS (polycystic ovarian syndrome)   . Hypertension     Past Surgical History  Procedure Laterality Date  . Cystoscopy    . Lithtripsy     Family History  Problem Relation Age of Onset  . Hypertension Father   . Cancer Paternal Grandmother     ovarian cancer  . Diabetes Paternal Grandfather   . Hypertension Paternal Grandfather     History   Social History  . Marital Status: Divorced    Spouse Name: N/A  . Number of Children: N/A  . Years of Education: N/A   Occupational History  . Not on file.   Social History Main Topics  . Smoking status: Former Smoker -- 0.30 packs/day for 2 years    Types: Cigarettes    Quit date: 12/02/2009  . Smokeless tobacco: Not on file  . Alcohol Use: Not on file  . Drug Use: Not on file  . Sexual Activity: Yes   Other Topics Concern  . Not on file   Social  History Narrative     Objective: BP 145/103 mmHg  Pulse 114  Temp(Src) 97.7 F (36.5 C) (Oral)  Wt 251 lb (113.853 kg)  SpO2 97%  General: Alert and Oriented, No Acute Distress however appears mildly fatigued HEENT: Pupils equal, round, reactive to light. Conjunctivae clear.  External ears unremarkable, canals clear with intact TMs with appropriate landmarks.  Middle ear appears open without effusion. Pink inferior turbinates.  Moist mucous membranes, pharynx without inflammation nor lesions.  Neck supple without palpable lymphadenopathy nor abnormal masses. Lungs: Clear to auscultation bilaterally, no wheezing/ronchi/rales.  Comfortable work of breathing. Good air movement. Cardiac: Regular rate and rhythm. Normal S1/S2.  No murmurs, rubs, nor gallops.   Abdomen: Obese soft nontender Extremities: No peripheral edema.  Strong peripheral pulses.  Mental Status: No depression, anxiety, nor agitation. Skin: Warm and dry.  Assessment & Plan: Samantha Cortez was seen today for generalized body aches.  Diagnoses and all orders for this visit:  Myalgia and myositis   Rapid flu test is negative. Suspect viral illness but not mono or the flu. Discussed that this should resolve within 7 days. Symptom control with meloxicam to help with myalgias and Zofran to help with nausea that she has at home. Stressed importance of staying well hydrated.   No Follow-up on file.

## 2014-08-17 NOTE — Telephone Encounter (Signed)
Thanks Marylene Landngela for looking out on the schedule... I called patient and left her a voicemail letting her know I switched her appt time to 3:30 on Jade's schedule

## 2014-09-03 ENCOUNTER — Ambulatory Visit: Payer: Commercial Managed Care - PPO | Admitting: Physician Assistant

## 2014-09-10 ENCOUNTER — Ambulatory Visit: Payer: Self-pay | Admitting: Physician Assistant

## 2014-10-01 ENCOUNTER — Ambulatory Visit: Payer: Self-pay | Admitting: Physician Assistant

## 2014-11-14 ENCOUNTER — Other Ambulatory Visit: Payer: Self-pay | Admitting: Physician Assistant

## 2014-11-15 ENCOUNTER — Other Ambulatory Visit: Payer: Self-pay | Admitting: *Deleted

## 2014-11-15 MED ORDER — NORGESTIMATE-ETH ESTRADIOL 0.25-35 MG-MCG PO TABS: 1.0000 | ORAL_TABLET | Freq: Every day | ORAL | Status: DC

## 2014-12-01 ENCOUNTER — Emergency Department
Admission: EM | Admit: 2014-12-01 | Discharge: 2014-12-01 | Payer: Commercial Managed Care - PPO | Source: Home / Self Care

## 2014-12-01 ENCOUNTER — Telehealth: Payer: Self-pay | Admitting: *Deleted

## 2014-12-01 NOTE — ED Notes (Unsigned)
12/01/2014- pt came into the clinic BS 533, sweating and feeling fatigued. last Hem A1C was 14 and she has not been taking her medication. advised her we would like to call EMS and have her transported to the ED for further treatment per Dr Cathren HarshBeese. pt refused EMS said that she would drive herself. refusal signed. Hadli Vandemark,LPN

## 2014-12-03 ENCOUNTER — Ambulatory Visit (INDEPENDENT_AMBULATORY_CARE_PROVIDER_SITE_OTHER): Payer: Commercial Managed Care - PPO | Admitting: Family Medicine

## 2014-12-03 ENCOUNTER — Encounter: Payer: Self-pay | Admitting: Family Medicine

## 2014-12-03 ENCOUNTER — Telehealth: Payer: Self-pay | Admitting: *Deleted

## 2014-12-03 VITALS — BP 139/97 | HR 107 | Wt 232.0 lb

## 2014-12-03 DIAGNOSIS — E119 Type 2 diabetes mellitus without complications: Secondary | ICD-10-CM | POA: Diagnosis not present

## 2014-12-03 MED ORDER — DAPAGLIFLOZIN PRO-METFORMIN ER 5-1000 MG PO TB24: 1.0000 | ORAL_TABLET | Freq: Two times a day (BID) | ORAL | Status: DC

## 2014-12-03 MED ORDER — METFORMIN HCL 1000 MG PO TABS: ORAL_TABLET | ORAL | Status: AC

## 2014-12-03 MED ORDER — DAPAGLIFLOZIN PRO-METFORMIN ER 5-1000 MG PO TB24: 1.0000 | ORAL_TABLET | Freq: Two times a day (BID) | ORAL | Status: AC

## 2014-12-03 NOTE — Progress Notes (Signed)
CC: Samantha Cortez is a 44 y.o. female is here for hospital f/u   Subjective: HPI:  Earlier this week she was feeling fatigued, had some sweats and nausea was seen at our urgent care center found to have a sugar above 500 and was sent to the emergency room. She received 2 L of fluid and insulin and left with a blood sugar around 300. She admits to poor compliance with metformin due to gastrointestinal complaints when taking a full tablet twice a day. No outside blood sugars to report other than that above. She's been trying her best to cut back on calories and carbohydrates in her diet. She currently denies any polyuria or polyphagia polydipsia, nausea, abdominal pain, confusion. She is reluctant about starting on insulin and wants to focus on oral medications if possible.   Review Of Systems Outlined In HPI  Past Medical History  Diagnosis Date  . Depression   . Thyroid disease   . History of kidney stones   . PCOS (polycystic ovarian syndrome)   . Hypertension     Past Surgical History  Procedure Laterality Date  . Cystoscopy    . Lithtripsy     Family History  Problem Relation Age of Onset  . Hypertension Father   . Cancer Paternal Grandmother     ovarian cancer  . Diabetes Paternal Grandfather   . Hypertension Paternal Grandfather     History   Social History  . Marital Status: Divorced    Spouse Name: N/A  . Number of Children: N/A  . Years of Education: N/A   Occupational History  . Not on file.   Social History Main Topics  . Smoking status: Former Smoker -- 0.30 packs/day for 2 years    Types: Cigarettes    Quit date: 12/02/2009  . Smokeless tobacco: Not on file  . Alcohol Use: Not on file  . Drug Use: Not on file  . Sexual Activity: Yes   Other Topics Concern  . Not on file   Social History Narrative     Objective: BP 139/97 mmHg  Pulse 107  Wt 232 lb (105.235 kg)  General: Alert and Oriented, No Acute Distress HEENT: Pupils equal, round,  reactive to light. Conjunctivae clear.  Moist mucous membranes Lungs: Clear to auscultation bilaterally, no wheezing/ronchi/rales.  Comfortable work of breathing. Good air movement. Cardiac: Regular rate and rhythm. Normal S1/S2.  No murmurs, rubs, nor gallops.   Extremities: No peripheral edema.  Strong peripheral pulses.  Mental Status: No depression, anxiety, nor agitation. Skin: Warm and dry.  Assessment & Plan: Luvia was seen today for hospital f/u.  Diagnoses and all orders for this visit:  Type 2 diabetes mellitus without complication Orders: -     Discontinue: Dapagliflozin-Metformin HCl ER (XIGDUO XR) 10-998 MG TB24; Take 1 tablet by mouth 2 (two) times daily. -     Dapagliflozin-Metformin HCl ER (XIGDUO XR) 10-998 MG TB24; Take 1 tablet by mouth 2 (two) times daily. -     BASIC METABOLIC PANEL WITH GFR   Type 2 diabetes: Uncontrolled, begin xigduo XR, hopefully the long-acting formulation of metformin will be better tolerated. I've asked her to start with only 1 tablet daily for 1 week and then increase to one twice a day. In 2 weeks I would also ask her to get a basic metabolic panel, lab slip provided today.  25 minutes spent face-to-face during visit today of which at least 50% was counseling or coordinating care regarding: 1. Type  2 diabetes mellitus without complication       Return in about 3 months (around 03/05/2015).

## 2014-12-03 NOTE — Telephone Encounter (Signed)
Pt states she went to [facility] and was told the xigduo was denied by her primary insurance and a PA is needed. I am just sending this to you because I didn't know if something else should be done since her blood sugars were high in the hospital and we are going into a holiday weekend.

## 2014-12-03 NOTE — Telephone Encounter (Signed)
Samantha Cortez, I put out a little more than a month's worth of farxiga on your desk.  Take only one tab a day, anytime of the day.  While we are waiting for the prior authorization delay she can start this medication along with metformin as long as tolerated with her gastrointestinal system. I need to point out that the medication we're waiting for prior authorization on contains metformin but the samples that I want to give her does not contain metformin which is the only reason why I'm asking her to restart her metformin.

## 2014-12-03 NOTE — Telephone Encounter (Signed)
[facility]

## 2014-12-19 ENCOUNTER — Other Ambulatory Visit: Payer: Self-pay | Admitting: Physician Assistant

## 2014-12-24 ENCOUNTER — Ambulatory Visit: Payer: Self-pay | Admitting: Physician Assistant

## 2014-12-24 ENCOUNTER — Ambulatory Visit: Payer: Commercial Managed Care - PPO | Admitting: Physician Assistant

## 2014-12-24 ENCOUNTER — Ambulatory Visit: Payer: Commercial Managed Care - PPO | Admitting: Family Medicine

## 2014-12-27 ENCOUNTER — Telehealth: Payer: Self-pay | Admitting: *Deleted

## 2014-12-27 NOTE — Telephone Encounter (Signed)
Pt is checking on the status of PA for xigduo

## 2014-12-30 LAB — HM DIABETES EYE EXAM

## 2015-01-01 ENCOUNTER — Other Ambulatory Visit: Payer: Self-pay | Admitting: Physician Assistant

## 2015-01-04 LAB — BASIC METABOLIC PANEL WITH GFR
BUN: 10 mg/dL (ref 7–25)
CALCIUM: 10.2 mg/dL (ref 8.6–10.2)
CO2: 21 mmol/L (ref 20–31)
Chloride: 102 mmol/L (ref 98–110)
Creat: 0.74 mg/dL (ref 0.50–1.10)
GFR, Est African American: >89 mL/min (ref >=60)
GLUCOSE: 151 mg/dL — AB (ref 65–99)
Potassium: 4.9 mmol/L (ref 3.5–5.3)
SODIUM: 138 mmol/L (ref 135–146)

## 2015-02-11 ENCOUNTER — Other Ambulatory Visit: Payer: Self-pay | Admitting: Family Medicine

## 2015-02-11 ENCOUNTER — Other Ambulatory Visit: Payer: Self-pay | Admitting: Physician Assistant

## 2015-03-10 ENCOUNTER — Encounter: Payer: Self-pay | Admitting: Family Medicine

## 2015-03-10 ENCOUNTER — Ambulatory Visit (INDEPENDENT_AMBULATORY_CARE_PROVIDER_SITE_OTHER): Payer: Self-pay | Admitting: Family Medicine

## 2015-03-10 DIAGNOSIS — Z0289 Encounter for other administrative examinations: Secondary | ICD-10-CM

## 2015-03-10 NOTE — Progress Notes (Signed)
No show  03/10/2015

## 2015-03-11 ENCOUNTER — Ambulatory Visit: Payer: Commercial Managed Care - PPO | Admitting: Family Medicine

## 2015-03-19 ENCOUNTER — Other Ambulatory Visit: Payer: Self-pay | Admitting: Physician Assistant

## 2015-03-19 ENCOUNTER — Other Ambulatory Visit: Payer: Self-pay | Admitting: Family Medicine

## 2015-03-23 ENCOUNTER — Telehealth: Payer: Self-pay | Admitting: Physician Assistant

## 2015-03-23 NOTE — Telephone Encounter (Signed)
Ok with me 

## 2015-03-23 NOTE — Telephone Encounter (Signed)
Pt went through My Chart to schedule her appt and requested that we switch her pcp to Dr.Hommel and has been seeing him the past few times[facility]... FYI

## 2015-03-23 NOTE — Telephone Encounter (Signed)
Sounds good to me

## 2015-03-25 ENCOUNTER — Ambulatory Visit: Payer: Self-pay | Admitting: Family Medicine

## 2015-04-01 ENCOUNTER — Ambulatory Visit: Payer: Commercial Managed Care - PPO | Admitting: Family Medicine

## 2015-04-08 ENCOUNTER — Encounter: Payer: Self-pay | Admitting: Family Medicine

## 2015-04-08 ENCOUNTER — Ambulatory Visit (INDEPENDENT_AMBULATORY_CARE_PROVIDER_SITE_OTHER): Payer: Commercial Managed Care - PPO | Admitting: Family Medicine

## 2015-04-08 VITALS — BP 143/99 | HR 109 | Wt 237.0 lb

## 2015-04-08 DIAGNOSIS — Z0000 Encounter for general adult medical examination without abnormal findings: Secondary | ICD-10-CM | POA: Diagnosis not present

## 2015-04-08 DIAGNOSIS — L989 Disorder of the skin and subcutaneous tissue, unspecified: Secondary | ICD-10-CM

## 2015-04-08 DIAGNOSIS — Z23 Encounter for immunization: Secondary | ICD-10-CM | POA: Diagnosis not present

## 2015-04-08 NOTE — Progress Notes (Signed)
CC: Samantha Cortez is a 44 y.o. female is here for Annual Exam   Subjective: HPI:  Colonoscopy: no current indication Papsmear: UTD from 2 years ago Mammogram:  Discussed risks and benefits of screening at 40 versus 44 years of age,she is agreeable to screening sooner rather than later, referral for mammogram has been placed  Influenza Vaccine: will get today Pneumovax: no current indication Td/Tdap: UTD from 6 years ago Zoster: (Start 45 yo)  Requesting complete physical exam with identification of 3 rubbery spots on her skin, they're localized on the right arm and left forearm.  Review of Systems - General ROS: negative for - chills, fever, night sweats, weight gain or weight loss Ophthalmic ROS: negative for - decreased vision Psychological ROS: negative for - anxiety or depression ENT ROS: negative for - hearing change, nasal congestion, tinnitus or allergies Hematological and Lymphatic ROS: negative for - bleeding problems, bruising or swollen lymph nodes Breast ROS: negative Respiratory ROS: no cough, shortness of breath, or wheezing Cardiovascular ROS: no chest pain or dyspnea on exertion Gastrointestinal ROS: no abdominal pain, change in bowel habits, or black or bloody stools Genito-Urinary ROS: negative for - genital discharge, genital ulcers, incontinence or abnormal bleeding from genitals Musculoskeletal ROS: negative for - joint pain or muscle pain Neurological ROS: negative for - headaches or memory loss Dermatological ROS: negative for lumps, mole changes, rash and skin lesion changes other than that described above  Past Medical History  Diagnosis Date  . Depression   . Thyroid disease   . History of kidney stones   . PCOS (polycystic ovarian syndrome)   . Hypertension     Past Surgical History  Procedure Laterality Date  . Cystoscopy    . Lithtripsy     Family History  Problem Relation Age of Onset  . Hypertension Father   . Cancer Paternal Grandmother      ovarian cancer  . Diabetes Paternal Grandfather   . Hypertension Paternal Grandfather     Social History   Social History  . Marital Status: Divorced    Spouse Name: N/A  . Number of Children: N/A  . Years of Education: N/A   Occupational History  . Not on file.   Social History Main Topics  . Smoking status: Former Smoker -- 0.30 packs/day for 2 years    Types: Cigarettes    Quit date: 12/02/2009  . Smokeless tobacco: Not on file  . Alcohol Use: Not on file  . Drug Use: Not on file  . Sexual Activity: Yes   Other Topics Concern  . Not on file   Social History Narrative     Objective: BP 143/99 mmHg  Pulse 109  Wt 237 lb (107.502 kg)  General: No Acute Distress HEENT: Atraumatic, normocephalic, conjunctivae normal without scleral icterus.  No nasal discharge, hearing grossly intact, TMs with good landmarks bilaterally with no middle ear abnormalities, posterior pharynx clear without oral lesions. Neck: Supple, trachea midline, no cervical nor supraclavicular adenopathy. Pulmonary: Clear to auscultation bilaterally without wheezing, rhonchi, nor rales. Cardiac: Regular rate and rhythm.  No murmurs, rubs, nor gallops. No peripheral edema.  2+ peripheral pulses bilaterally. Abdomen: Bowel sounds normal.  No masses.  Non-tender without rebound.  Negative Murphy's sign. MSK: Grossly intact, no signs of weakness.  Full strength throughout upper and lower extremities.  Full ROM in upper and lower extremities.  No midline spinal tenderness. Neuro: Gait unremarkable, CN II-XII grossly intact.  C5-C6 Reflex 2/4 Bilaterally, L4 Reflex 2/4  Bilaterally.  Cerebellar function intact. Skin: No rashes. Dermatofibroma on the right arm 2, left forearm 1 Psych: Alert and oriented to person/place/time.  Thought process normal. No anxiety/depression.  Assessment & Plan: Samantha Cortez was seen today for annual exam.  Diagnoses and all orders for this visit:  Annual physical exam -      TSH -     Hemoglobin A1c -     Lipid panel -     CBC -     COMPLETE METABOLIC PANEL WITH GFR -     MM DIGITAL SCREENING BILATERAL; Future  Skin lesion -     Ambulatory referral to Dermatology  Encounter for immunization  Other orders -     Flu Vaccine QUAD 36+ mos IM   Healthy lifestyle interventions including but not limited to regular exercise, a healthy low fat diet, moderation of salt intake, the dangers of tobacco/alcohol/recreational drug use, nutrition supplementation, and accident avoidance were discussed with the patient and a handout was provided for future reference. She like to have her dermatofibroma is removed, I made sure that she knows that these are benign however she does not like the cosmetic appearance of this therefore referral has been sent to dermatology   Return in about 3 months (around 07/09/2015) for Diabetes Follow Up.

## 2015-04-11 ENCOUNTER — Other Ambulatory Visit: Payer: Self-pay | Admitting: Physician Assistant

## 2015-04-11 ENCOUNTER — Other Ambulatory Visit: Payer: Self-pay | Admitting: Family Medicine

## 2015-04-12 ENCOUNTER — Encounter: Payer: Self-pay | Admitting: Family Medicine

## 2015-04-12 ENCOUNTER — Other Ambulatory Visit: Payer: Self-pay | Admitting: Family Medicine

## 2015-04-22 ENCOUNTER — Ambulatory Visit: Payer: Commercial Managed Care - PPO | Admitting: Family Medicine

## 2015-05-06 ENCOUNTER — Ambulatory Visit: Payer: Self-pay | Admitting: Family Medicine

## 2015-05-15 ENCOUNTER — Other Ambulatory Visit: Payer: Self-pay | Admitting: Physician Assistant

## 2015-05-19 ENCOUNTER — Ambulatory Visit: Payer: Self-pay | Admitting: Family Medicine

## 2015-05-20 ENCOUNTER — Ambulatory Visit: Payer: Self-pay | Admitting: Family Medicine

## 2015-06-03 ENCOUNTER — Telehealth: Payer: Self-pay | Admitting: Family

## 2015-06-03 DIAGNOSIS — J069 Acute upper respiratory infection, unspecified: Secondary | ICD-10-CM

## 2015-06-03 NOTE — Progress Notes (Signed)
E visit for Flu like symptoms   We are sorry that you are not feeling well.  Here is how we plan to help! Based on what you have shared with me it looks like you may have flu-like symptoms that should be watched but do not seem to indicate anti-viral treatment.  Influenza or "the flu" is   an infection caused by a respiratory virus. The flu virus is highly contagious and persons who did not receive their yearly flu vaccination may "catch" the flu from close contact.  We have anti-viral medications to treat the viruses that cause this infection. They are not a "cure" and only shorten the course of the infection. These prescriptions are most effective when they are given within the first 2 days of "flu" symptoms. Antiviral medication are indicated if you have a high risk of complications from the flu. You should  also consider an antiviral medication if you are in close contact with someone who is at risk. These medications can help patients avoid complications from the flu  but have side effects that you should know. Possible side effects from Tamiflu or oseltamivir include nausea, vomiting, diarrhea, dizziness, headaches, eye redness, sleep problems or other respiratory symptoms. You should not take Tamiflu if you have an allergy to oseltamivir or any to the ingredients in Tamiflu.  Based upon your symptoms and potential risk factors I recommend that you follow the flu symptoms recommendation that I have listed below.  *Continue to observe the symptoms as it does not yet meet criteria for the flu, which consists of a high fever, shortness of breath, severe pain in chest/abdomen, and sometimes a rash. This does seem like a viral upper respiratory infection and supportive care is recommended.   ANYONE WHO HAS FLU SYMPTOMS SHOULD: . Stay home. The flu is highly contagious and going out or to work exposes others! . Be sure to drink plenty of fluids. Water is fine as well as fruit juices, sodas and  electrolyte beverages. You may want to stay away from caffeine or alcohol. If you are nauseated, try taking small sips of liquids. How do you know if you are getting enough fluid? Your urine should be a pale yellow or almost colorless. . Get rest. . Taking a steamy shower or using a humidifier may help nasal congestion and ease sore throat pain. Using a saline nasal spray works much the same way. . Cough drops, hard candies and sore throat lozenges may ease your cough. . Line up a caregiver. Have someone check on you regularly.   GET HELP RIGHT AWAY IF: . You cannot keep down liquids or your medications. . You become short of breath . Your fell like you are going to pass out or loose consciousness. . Your symptoms persist after you have completed your treatment plan MAKE SURE YOU   Understand these instructions.  Will watch your condition.  Will get help right away if you are not doing well or get worse.  Your e-visit answers were reviewed by a board certified advanced clinical practitioner to complete your personal care plan.  Depending on the condition, your plan could have included both over the counter or prescription medications.  If there is a problem please reply  once you have received a response from your provider.  Your safety is important to us.  If you have drug allergies check your prescription carefully.    You can use MyChart to ask questions about today's visit, request a non-urgent call  back, or ask for a work or school excuse for 24 hours related to this e-Visit. If it has been greater than 24 hours you will need to follow up with your provider, or enter a new e-Visit to address those concerns.  You will get an e-mail in the next two days asking about your experience.  I hope that your e-visit has been valuable and will speed your recovery. Thank you for using e-visits.

## 2015-06-05 ENCOUNTER — Other Ambulatory Visit: Payer: Self-pay | Admitting: Family Medicine

## 2015-06-05 ENCOUNTER — Other Ambulatory Visit: Payer: Self-pay | Admitting: Physician Assistant

## 2015-07-08 ENCOUNTER — Ambulatory Visit: Payer: Commercial Managed Care - PPO | Admitting: Family Medicine

## 2015-07-15 ENCOUNTER — Ambulatory Visit (INDEPENDENT_AMBULATORY_CARE_PROVIDER_SITE_OTHER): Payer: Commercial Managed Care - PPO | Admitting: Family Medicine

## 2015-07-15 ENCOUNTER — Encounter: Payer: Self-pay | Admitting: Family Medicine

## 2015-07-15 DIAGNOSIS — Z0289 Encounter for other administrative examinations: Secondary | ICD-10-CM

## 2015-07-15 NOTE — Progress Notes (Signed)
No show 07/15/2015, 4th no show in the past six months

## 2015-07-18 ENCOUNTER — Other Ambulatory Visit: Payer: Self-pay | Admitting: Family Medicine

## 2015-07-20 NOTE — Telephone Encounter (Signed)
[place], Rx placed in in-box ready for pickup/faxing. I'm ok with her receiving this even though she was DCed last week.  She was [facility] for no-shows, my decision would be different if it were from misbehavior.

## 2015-08-21 ENCOUNTER — Other Ambulatory Visit: Payer: Self-pay | Admitting: Physician Assistant

## 2015-08-22 ENCOUNTER — Other Ambulatory Visit: Payer: Self-pay | Admitting: Family Medicine

## 2015-08-23 NOTE — Telephone Encounter (Signed)
Samantha Cortez, Rx was declined as patient was discharged for no shows on 07/15/15.

## 2015-08-23 NOTE — Telephone Encounter (Signed)
Pt has 200 mcg and 25 mcg of levothyroxine on her list.  Please advise.

## 2015-08-28 ENCOUNTER — Other Ambulatory Visit: Payer: Self-pay | Admitting: Physician Assistant

## 2015-08-29 NOTE — Telephone Encounter (Signed)
No longer Jade's pt.

## 2015-08-30 NOTE — Telephone Encounter (Signed)
Patient no longer an active patient with our clinic

## 2015-09-03 ENCOUNTER — Other Ambulatory Visit: Payer: Self-pay | Admitting: Family Medicine

## 2015-09-08 ENCOUNTER — Other Ambulatory Visit: Payer: Self-pay | Admitting: Physician Assistant

## 2015-09-21 ENCOUNTER — Ambulatory Visit: Payer: Self-pay | Admitting: Physician Assistant

## 2015-10-22 ENCOUNTER — Other Ambulatory Visit: Payer: Self-pay | Admitting: Family Medicine

## 2016-01-28 ENCOUNTER — Encounter: Payer: Self-pay | Admitting: Family Medicine

## 2016-07-12 ENCOUNTER — Other Ambulatory Visit: Payer: Self-pay | Admitting: Physician Assistant

## 2016-07-22 ENCOUNTER — Other Ambulatory Visit: Payer: Self-pay | Admitting: Physician Assistant

## 2016-09-26 ENCOUNTER — Ambulatory Visit (HOSPITAL_COMMUNITY): Payer: Commercial Managed Care - PPO | Admitting: Licensed Clinical Social Worker

## 2017-10-23 ENCOUNTER — Encounter: Payer: Self-pay | Admitting: *Deleted

## 2017-10-23 ENCOUNTER — Emergency Department (INDEPENDENT_AMBULATORY_CARE_PROVIDER_SITE_OTHER)
Admission: EM | Admit: 2017-10-23 | Discharge: 2017-10-23 | Disposition: A | Discharge status: Home / Self Care | Payer: Commercial Managed Care - PPO | Source: Home / Self Care | Attending: Family Medicine | Admitting: Family Medicine

## 2017-10-23 ENCOUNTER — Other Ambulatory Visit: Payer: Self-pay

## 2017-10-23 DIAGNOSIS — R21 Rash and other nonspecific skin eruption: Secondary | ICD-10-CM | POA: Diagnosis not present

## 2017-10-23 MED ORDER — ERYTHROMYCIN 2 % EX SOLN: Freq: Two times a day (BID) | CUTANEOUS | 0 refills | Status: AC

## 2017-10-23 NOTE — ED Triage Notes (Signed)
Pt c/o rash on her neck x 1 -2 wks; getting worse the past few days and is now itching.

## 2017-10-23 NOTE — ED Provider Notes (Signed)
[facility]    CSN: [id number] Arrival date & time: 10/23/17  1904     History   Chief Complaint Chief Complaint  Patient presents with  . Rash    HPI Samantha Cortez is a 47 y.o. female.   HPI Samantha Cortez is a 47 y.o. female 1-2 weeks of gradually worsening rash along the font of her neck.  The rash is minimally itchy. It does not hurt.  She has tried triamcinolone cream without relief so she tried miconazole cream, also without relief.  Denies new soaps, lotions or medications. Pt reports due to her PCOS she does have to shave facial hair on occasion but states the rash is below where she shaves. She notes the rash feels rough in texture. No hx of similar rashes. No other rashes on her body.  Hx of DM but states her PCP recently took her off her metformin because her HgbA1c was back to normal.   Past Medical History:  Diagnosis Date  . Depression   . History of kidney stones   . Hypertension   . PCOS (polycystic ovarian syndrome)   . Thyroid disease     Patient Active Problem List   Diagnosis Date Noted  . Thyroid activity decreased 07/30/2014  . Acanthosis nigricans 10/02/2013  . Diabetes mellitus, type II (HCC) 03/20/2013  . Insomnia 02/01/2012  . Hypertension 12/19/2011  . Depression 12/19/2011  . PCOS (polycystic ovarian syndrome) 12/19/2011  . Hypothyroidism 12/19/2011    Past Surgical History:  Procedure Laterality Date  . CYSTOSCOPY    . lithtripsy      OB History   None      Home Medications    Prior to Admission medications   Medication Sig Start Date End Date Taking? Authorizing Provider  levothyroxine (SYNTHROID, LEVOTHROID) 200 MCG tablet TAKE ONE TABLET BY MOUTH EVERY DAY BEFORE BREAKFAST. Due for lab work. 02/14/15  Yes Breeback, Jade L, PA-C  lisinopril (PRINIVIL,ZESTRIL) 10 MG tablet Take 10 mg by mouth daily.   Yes [provider]  spironolactone (ALDACTONE) 25 MG tablet TAKE 1 TABLET (25 MG TOTAL) BY MOUTH  DAILY. NEED FOLLOW UP APPOINTMENT FOR MORE REFILLS 10/24/15  Yes Hommel, Sean, DO  AMBULATORY NON FORMULARY MEDICATION accucheck glucose device, test strips and lancets.   Dx: 250.00 testing for once a day. 08/14/13   Breeback, Lonna Cobb, PA-C  amitriptyline (ELAVIL) 100 MG tablet Take 2 tablets (200 mg total) by mouth at bedtime. NEED FOLLOW UP APPOINTMENT FOR MORE REFILLS 08/23/15   Laren Boom, DO  buPROPion (WELLBUTRIN XL) 150 MG 24 hr tablet Take 1 tablet (150 mg total) by mouth daily. NEED FOLLOW UP APPOINTMENT FOR MORE REFILLS 08/23/15   Laren Boom, DO  Dapagliflozin-Metformin HCl ER (XIGDUO XR) 10-998 MG TB24 Take 1 tablet by mouth 2 (two) times daily. 12/03/14   Laren Boom, DO  Eflornithine HCl 13.9 % cream Use at bedtime once a day. 12/24/13   Breeback, Lesly Rubenstein L, PA-C  erythromycin with ethanol (THERAMYCIN) 2 % external solution Apply topically 2 (two) times daily. For 5 days 10/23/17   Lurene Shadow, PA-C  escitalopram (LEXAPRO) 20 MG tablet TAKE ONE TABLET BY MOUTH ONE TIME DAILY 06/07/15   Hommel, Sean, DO  ESTARYLLA 0.25-35 MG-MCG tablet TAKE 1 TABLET BY MOUTH DAILY. 04/11/15   Laren Boom, DO  levothyroxine (SYNTHROID, LEVOTHROID) 25 MCG tablet Take 1 tablet (25 mcg total) by mouth daily. Due for lab work. 02/14/15   Jomarie Longs,  PA-C  LORazepam (ATIVAN) 0.5 MG tablet Take 1 tablet (0.5 mg total) by mouth at bedtime as needed for anxiety or sleep. 07/20/15   Laren Boom, DO  meloxicam (MOBIC) 15 MG tablet Take 1 tablet (15 mg total) by mouth daily. To help with muscle aches. 08/16/14   Laren Boom, DO  metoprolol succinate (TOPROL-XL) 25 MG 24 hr tablet TAKE ONE TABLET BY MOUTH ONE TIME DAILY 06/07/15   Hommel, Sean, DO  ondansetron (ZOFRAN-ODT) 4 MG disintegrating tablet Take 1 tablet (4 mg total) by mouth every 8 (eight) hours as needed for nausea. 12/23/12   Lajean Manes, MD  pantoprazole (PROTONIX) 40 MG tablet Take 1 tablet (40 mg total) by mouth daily. NEED FOLLOW UP APPOINTMENT FOR  MORE REFILLS 08/23/15   Laren Boom, DO  promethazine (PHENERGAN) 25 MG tablet Take 1 tablet (25 mg total) by mouth every 6 (six) hours as needed for nausea or vomiting. 01/20/14   Breeback, Jade L, PA-C  SUMAtriptan (IMITREX) 25 MG tablet Take 1 tablet (25 mg total) by mouth every 2 (two) hours as needed for migraine or headache. May repeat in 2 hours if headache persists or recurs. 07/30/14   Jomarie Longs, PA-C    Family History Family History  Problem Relation Age of Onset  . Hypertension Father   . Cancer Paternal Grandmother        ovarian cancer  . Diabetes Paternal Grandfather   . Hypertension Paternal Grandfather     Social History Social History   Tobacco Use  . Smoking status: Former Smoker    Packs/day: 0.30    Years: 2.00    Pack years: 0.60    Types: Cigarettes    Last attempt to quit: 12/02/2009    Years since quitting: 7.8  . Smokeless tobacco: Never Used  Substance Use Topics  . Alcohol use: Yes    Comment: 4 q wk  . Drug use: Never     Allergies   Fetzima [levomilnacipran] and Penicillins   Review of Systems Review of Systems  HENT: Negative for sore throat, trouble swallowing and voice change.   Musculoskeletal: Negative for arthralgias, myalgias and neck pain.  Skin: Positive for rash. Negative for wound.     Physical Exam Triage Vital Signs ED Triage Vitals  Enc Vitals Group     BP 10/23/17 1929 123/86     Pulse Rate 10/23/17 1929 (!) 102     Resp 10/23/17 1929 16     Temp 10/23/17 1929 98.4 F (36.9 C)     Temp Source 10/23/17 1929 Oral     SpO2 10/23/17 1929 98 %     Weight 10/23/17 1930 199 lb (90.3 kg)     Height 10/23/17 1930  (1.549 m)     Head Circumference --      Peak Flow --      Pain Score 10/23/17 1930 0     Pain Loc --      Pain Edu? --      Excl. in GC? --    No data found.  Updated Vital Signs BP 123/86 (BP Location: Right Arm)   Pulse (!) 102   Temp 98.4 F (36.9 C) (Oral)   Resp 16   Ht  (1.549 m)    Wt 199 lb (90.3 kg)   LMP 08/23/2017   SpO2 98%   BMI 37.60 kg/m   Visual Acuity Right Eye Distance:   Left Eye Distance:   Bilateral Distance:  Right Eye Near:   Left Eye Near:    Bilateral Near:     Physical Exam  Constitutional: She is oriented to person, place, and time. She appears well-developed and well-nourished. No distress.  HENT:  Head: Normocephalic. Head is with abrasion (rash with overlying appearing abrasion on anterior neck).    Nose: Nose normal.  Mouth/Throat: Uvula is midline, oropharynx is clear and moist and mucous membranes are normal.  Eyes: EOM are normal.  Neck: Normal range of motion.  Cardiovascular: Normal rate.  Pulmonary/Chest: Effort normal.  Musculoskeletal: Normal range of motion.  Neurological: She is alert and oriented to person, place, and time.  Skin: Skin is warm and dry. Rash noted. She is not diaphoretic. There is erythema.  Anterior neck: 4cm roughened abrasion type appearance over faint erythematous rash. Non-tender. No bleeding or discharge.   Psychiatric: She has a normal mood and affect. Her behavior is normal.  Nursing note and vitals reviewed.    UC Treatments / Results  Labs (all labs ordered are listed, but only abnormal results are displayed) Labs Reviewed - No data to display  EKG None  Radiology No results found.  Procedures Procedures (including critical care time)  Medications Ordered in UC Medications - No data to display  Initial Impression / Assessment and Plan / UC Course  I have reviewed the triage vital signs and the nursing notes.  Pertinent labs & imaging results that were available during my care of the patient were reviewed by me and considered in my medical decision making (see chart for details).     Because pt has already tried triamcinolone and miconazole, will try an antibiotic ointment to see if it will help with potential underlying bacterial skin infection. Encouraged f/u with  dermatology in 1 week if not improving.  Final Clinical Impressions(s) / UC Diagnoses   Final diagnoses:  Rash and nonspecific skin eruption   Discharge Instructions   None    ED Prescriptions    Medication Sig Dispense Auth. Provider   erythromycin with ethanol (THERAMYCIN) 2 % external solution Apply topically 2 (two) times daily. For 5 days 60 mL Lurene Shadow, PA-C     Controlled Substance Prescriptions [place] Controlled Substance Registry consulted? Not Applicable   Rolla Plate 10/24/17 7829
